# Patient Record
Sex: Male | Born: 1970 | Race: Black or African American | Hispanic: No | Marital: Single | Smoking: Never smoker
Health system: Southern US, Community
[De-identification: ages and names within clinical notes are randomized; demographics above are authoritative.]

## PROBLEM LIST (undated history)

## (undated) DIAGNOSIS — I10 Essential (primary) hypertension: Secondary | ICD-10-CM

## (undated) DIAGNOSIS — B20 Human immunodeficiency virus [HIV] disease: Secondary | ICD-10-CM

## (undated) DIAGNOSIS — N189 Chronic kidney disease, unspecified: Secondary | ICD-10-CM

## (undated) HISTORY — DX: Chronic kidney disease, unspecified: N18.9

## (undated) HISTORY — PX: OTHER SURGICAL HISTORY: SHX169

---

## 1998-11-16 ENCOUNTER — Encounter: Payer: Self-pay | Admitting: Infectious Disease

## 2007-05-11 ENCOUNTER — Ambulatory Visit: Payer: Self-pay | Admitting: Infectious Disease

## 2007-05-11 ENCOUNTER — Encounter: Admission: RE | Admit: 2007-05-11 | Discharge: 2007-05-11 | Payer: Self-pay | Admitting: Infectious Disease

## 2007-05-11 ENCOUNTER — Encounter: Payer: Self-pay | Admitting: Internal Medicine

## 2007-05-11 DIAGNOSIS — I10 Essential (primary) hypertension: Secondary | ICD-10-CM | POA: Insufficient documentation

## 2007-05-11 DIAGNOSIS — B20 Human immunodeficiency virus [HIV] disease: Secondary | ICD-10-CM | POA: Insufficient documentation

## 2007-05-11 LAB — CONVERTED CEMR LAB
Albumin: 4.7 g/dL (ref 3.5–5.2)
BUN: 16 mg/dL (ref 6–23)
CO2: 22 meq/L (ref 19–32)
Eosinophils Absolute: 0.1 10*3/uL — ABNORMAL LOW (ref 0.2–0.7)
Eosinophils Relative: 2 % (ref 0–5)
GC Probe Amp, Urine: NEGATIVE
Glucose, Bld: 82 mg/dL (ref 70–99)
HCT: 44.1 % (ref 39.0–52.0)
HCV Ab: NEGATIVE
Hemoglobin: 14.7 g/dL (ref 13.0–17.0)
Hep B Core Total Ab: NEGATIVE
Hep B S Ab: NEGATIVE
Hep B S Ab: NEGATIVE
Hepatitis B Surface Ag: NEGATIVE
Hepatitis B Surface Ag: NEGATIVE
Leukocytes, UA: NEGATIVE
Lymphocytes Relative: 38 % (ref 12–46)
Lymphs Abs: 2.3 10*3/uL (ref 0.7–4.0)
MCV: 95.2 fL (ref 78.0–100.0)
Monocytes Absolute: 0.4 10*3/uL (ref 0.1–1.0)
Monocytes Relative: 7 % (ref 3–12)
Nitrite: NEGATIVE
Platelets: 180 10*3/uL (ref 150–400)
RBC / HPF: NONE SEEN (ref <3)
RBC: 4.63 M/uL (ref 4.22–5.81)
Sodium: 142 meq/L (ref 135–145)
Specific Gravity, Urine: 1.015 (ref 1.005–1.03)
Total Bilirubin: 0.6 mg/dL (ref 0.3–1.2)
Total Protein: 7.6 g/dL (ref 6.0–8.3)
Urine Glucose: NEGATIVE mg/dL
WBC: 5.9 10*3/uL (ref 4.0–10.5)
pH: 5.5 (ref 5.0–8.0)

## 2007-05-27 ENCOUNTER — Ambulatory Visit: Payer: Self-pay | Admitting: Infectious Disease

## 2007-05-27 DIAGNOSIS — E291 Testicular hypofunction: Secondary | ICD-10-CM | POA: Insufficient documentation

## 2007-05-27 DIAGNOSIS — N189 Chronic kidney disease, unspecified: Secondary | ICD-10-CM | POA: Insufficient documentation

## 2007-05-27 DIAGNOSIS — A630 Anogenital (venereal) warts: Secondary | ICD-10-CM | POA: Insufficient documentation

## 2007-05-27 LAB — CONVERTED CEMR LAB
BUN: 19 mg/dL (ref 6–23)
CO2: 22 meq/L (ref 19–32)
Chloride: 107 meq/L (ref 96–112)
Creatinine, Ser: 1.96 mg/dL — ABNORMAL HIGH (ref 0.40–1.50)
Glucose, Bld: 86 mg/dL (ref 70–99)

## 2007-06-09 ENCOUNTER — Telehealth: Payer: Self-pay | Admitting: Infectious Disease

## 2007-06-09 DIAGNOSIS — R809 Proteinuria, unspecified: Secondary | ICD-10-CM

## 2007-06-22 ENCOUNTER — Encounter: Payer: Self-pay | Admitting: Infectious Disease

## 2007-08-31 ENCOUNTER — Ambulatory Visit: Payer: Self-pay | Admitting: Infectious Disease

## 2007-08-31 ENCOUNTER — Encounter: Admission: RE | Admit: 2007-08-31 | Discharge: 2007-08-31 | Payer: Self-pay | Admitting: Infectious Diseases

## 2007-08-31 LAB — CONVERTED CEMR LAB
AST: 15 units/L (ref 0–37)
Albumin: 4.7 g/dL (ref 3.5–5.2)
Alkaline Phosphatase: 63 units/L (ref 39–117)
BUN: 10 mg/dL (ref 6–23)
Basophils Absolute: 0 10*3/uL (ref 0.0–0.1)
Basophils Relative: 0 % (ref 0–1)
Eosinophils Absolute: 0 10*3/uL (ref 0.0–0.7)
MCHC: 33.9 g/dL (ref 30.0–36.0)
MCV: 101.2 fL — ABNORMAL HIGH (ref 78.0–100.0)
Monocytes Relative: 12 % (ref 3–12)
Neutrophils Relative %: 53 % (ref 43–77)
Platelets: 210 10*3/uL (ref 150–400)
Potassium: 4.2 meq/L (ref 3.5–5.3)
RDW: 17.1 % — ABNORMAL HIGH (ref 11.5–15.5)
Sodium: 141 meq/L (ref 135–145)

## 2007-09-14 ENCOUNTER — Ambulatory Visit: Payer: Self-pay | Admitting: Infectious Disease

## 2007-09-21 ENCOUNTER — Ambulatory Visit: Payer: Self-pay | Admitting: Infectious Disease

## 2007-09-21 LAB — CONVERTED CEMR LAB
CO2: 23 meq/L (ref 19–32)
Calcium: 10.2 mg/dL (ref 8.4–10.5)
Glucose, Bld: 81 mg/dL (ref 70–99)
Sodium: 140 meq/L (ref 135–145)

## 2007-10-13 ENCOUNTER — Encounter: Payer: Self-pay | Admitting: Infectious Disease

## 2007-10-16 ENCOUNTER — Telehealth: Payer: Self-pay | Admitting: Infectious Disease

## 2007-12-02 ENCOUNTER — Encounter: Admission: RE | Admit: 2007-12-02 | Discharge: 2007-12-02 | Payer: Self-pay | Admitting: Infectious Disease

## 2007-12-02 ENCOUNTER — Ambulatory Visit: Payer: Self-pay | Admitting: Infectious Disease

## 2007-12-02 LAB — CONVERTED CEMR LAB
ALT: 15 units/L (ref 0–53)
Alkaline Phosphatase: 54 units/L (ref 39–117)
Basophils Absolute: 0 10*3/uL (ref 0.0–0.1)
Basophils Relative: 0 % (ref 0–1)
Eosinophils Absolute: 0.1 10*3/uL (ref 0.0–0.7)
Eosinophils Relative: 1 % (ref 0–5)
Free T4: 1.22 ng/dL (ref 0.89–1.80)
HCT: 44.8 % (ref 39.0–52.0)
LDL Cholesterol: 95 mg/dL (ref 0–99)
MCV: 99.1 fL (ref 78.0–100.0)
Platelets: 186 10*3/uL (ref 150–400)
RDW: 15.2 % (ref 11.5–15.5)
Sodium: 141 meq/L (ref 135–145)
T3, Total: 123.8 ng/dL (ref 80.0–204.0)
Total Bilirubin: 3.3 mg/dL — ABNORMAL HIGH (ref 0.3–1.2)
Total Protein: 7.5 g/dL (ref 6.0–8.3)
VLDL: 65 mg/dL — ABNORMAL HIGH (ref 0–40)

## 2007-12-10 ENCOUNTER — Telehealth (INDEPENDENT_AMBULATORY_CARE_PROVIDER_SITE_OTHER): Payer: Self-pay | Admitting: *Deleted

## 2007-12-21 ENCOUNTER — Ambulatory Visit: Payer: Self-pay | Admitting: Infectious Disease

## 2008-01-21 ENCOUNTER — Encounter (INDEPENDENT_AMBULATORY_CARE_PROVIDER_SITE_OTHER): Payer: Self-pay | Admitting: General Surgery

## 2008-01-21 ENCOUNTER — Ambulatory Visit (HOSPITAL_BASED_OUTPATIENT_CLINIC_OR_DEPARTMENT_OTHER): Admission: RE | Admit: 2008-01-21 | Discharge: 2008-01-21 | Payer: Self-pay | Admitting: General Surgery

## 2008-01-29 ENCOUNTER — Encounter: Payer: Self-pay | Admitting: Infectious Disease

## 2008-06-23 ENCOUNTER — Ambulatory Visit: Payer: Self-pay | Admitting: Infectious Disease

## 2008-06-23 LAB — CONVERTED CEMR LAB
Alkaline Phosphatase: 53 units/L (ref 39–117)
Bilirubin Urine: NEGATIVE
Cholesterol: 135 mg/dL (ref 0–200)
Creatinine, Ser: 1.75 mg/dL — ABNORMAL HIGH (ref 0.40–1.50)
Eosinophils Absolute: 0.1 10*3/uL (ref 0.0–0.7)
Eosinophils Relative: 1 % (ref 0–5)
Glucose, Bld: 88 mg/dL (ref 70–99)
HCT: 41.2 % (ref 39.0–52.0)
HDL: 22 mg/dL — ABNORMAL LOW (ref >39)
HIV 1 RNA Quant: 81 copies/mL — ABNORMAL HIGH (ref <48)
HIV-1 RNA Quant, Log: 1.91 — ABNORMAL HIGH (ref <1.68)
Hemoglobin, Urine: NEGATIVE
Hemoglobin: 14.6 g/dL (ref 13.0–17.0)
Ketones, ur: NEGATIVE mg/dL
LDL Cholesterol: 44 mg/dL (ref 0–99)
Lymphs Abs: 1.8 10*3/uL (ref 0.7–4.0)
MCV: 96.7 fL (ref 78.0–100.0)
Monocytes Absolute: 0.6 10*3/uL (ref 0.1–1.0)
Nitrite: NEGATIVE
Platelets: 177 10*3/uL (ref 150–400)
Protein, ur: 30 mg/dL — AB
RDW: 15 % (ref 11.5–15.5)
Sodium: 143 meq/L (ref 135–145)
Total Bilirubin: 1 mg/dL (ref 0.3–1.2)
Total CHOL/HDL Ratio: 6.1
Total Protein: 7.4 g/dL (ref 6.0–8.3)
Triglycerides: 343 mg/dL — ABNORMAL HIGH (ref <150)
Urobilinogen, UA: 1 (ref 0.0–1.0)
VLDL: 69 mg/dL — ABNORMAL HIGH (ref 0–40)
WBC: 5.2 10*3/uL (ref 4.0–10.5)

## 2008-07-05 ENCOUNTER — Encounter: Payer: Self-pay | Admitting: Infectious Disease

## 2008-07-29 ENCOUNTER — Encounter: Payer: Self-pay | Admitting: Infectious Disease

## 2008-08-29 ENCOUNTER — Ambulatory Visit: Payer: Self-pay | Admitting: Infectious Disease

## 2008-08-29 DIAGNOSIS — E781 Pure hyperglyceridemia: Secondary | ICD-10-CM

## 2008-08-29 LAB — CONVERTED CEMR LAB
ALT: 14 units/L (ref 0–53)
AST: 17 units/L (ref 0–37)
Bacteria, UA: NONE SEEN
Basophils Relative: 0 % (ref 0–1)
Bilirubin Urine: NEGATIVE
CO2: 23 meq/L (ref 19–32)
Chlamydia, Swab/Urine, PCR: NEGATIVE
Chloride: 104 meq/L (ref 96–112)
Creatinine, Ser: 1.92 mg/dL — ABNORMAL HIGH (ref 0.40–1.50)
Eosinophils Absolute: 0 10*3/uL (ref 0.0–0.7)
Ketones, ur: NEGATIVE mg/dL
Lymphs Abs: 1.7 10*3/uL (ref 0.7–4.0)
Monocytes Relative: 11 % (ref 3–12)
Neutro Abs: 3.3 10*3/uL (ref 1.7–7.7)
Neutrophils Relative %: 58 % (ref 43–77)
Platelets: 174 10*3/uL (ref 150–400)
RBC / HPF: NONE SEEN (ref <3)
RBC: 4.48 M/uL (ref 4.22–5.81)
Sodium: 138 meq/L (ref 135–145)
Specific Gravity, Urine: 1.015 (ref 1.005–1.030)
Testosterone: 331.02 ng/dL — ABNORMAL LOW (ref 350–890)
Total Bilirubin: 0.9 mg/dL (ref 0.3–1.2)
Total Protein: 7.8 g/dL (ref 6.0–8.3)
Urine Glucose: NEGATIVE mg/dL
WBC, UA: NONE SEEN cells/hpf (ref <3)
WBC: 5.6 10*3/uL (ref 4.0–10.5)

## 2008-09-01 ENCOUNTER — Encounter: Payer: Self-pay | Admitting: Infectious Disease

## 2008-09-01 LAB — CONVERTED CEMR LAB
HIV 1 RNA Quant: 1820 copies/mL — ABNORMAL HIGH (ref <48)
HIV-1 RNA Quant, Log: 3.26 — ABNORMAL HIGH (ref <1.68)

## 2008-09-06 ENCOUNTER — Telehealth: Payer: Self-pay | Admitting: Infectious Disease

## 2008-12-12 ENCOUNTER — Ambulatory Visit: Payer: Self-pay | Admitting: Infectious Disease

## 2008-12-12 LAB — CONVERTED CEMR LAB
ALT: 18 units/L (ref 0–53)
Alkaline Phosphatase: 58 units/L (ref 39–117)
BUN: 11 mg/dL (ref 6–23)
Basophils Absolute: 0 10*3/uL (ref 0.0–0.1)
Calcium: 9.6 mg/dL (ref 8.4–10.5)
Creatinine, Ser: 1.79 mg/dL — ABNORMAL HIGH (ref 0.40–1.50)
Eosinophils Absolute: 0.1 10*3/uL (ref 0.0–0.7)
Glucose, Bld: 101 mg/dL — ABNORMAL HIGH (ref 70–99)
HDL: 23 mg/dL — ABNORMAL LOW (ref >39)
HIV 1 RNA Quant: 1040 copies/mL — ABNORMAL HIGH (ref <48)
Lymphocytes Relative: 50 % — ABNORMAL HIGH (ref 12–46)
Lymphs Abs: 2.4 10*3/uL (ref 0.7–4.0)
MCV: 96.6 fL (ref 78.0–100.0)
Neutrophils Relative %: 40 % — ABNORMAL LOW (ref 43–77)
Platelets: 159 10*3/uL (ref 150–400)
RDW: 17.2 % — ABNORMAL HIGH (ref 11.5–15.5)
Total Bilirubin: 1 mg/dL (ref 0.3–1.2)
Total Protein: 7.4 g/dL (ref 6.0–8.3)
WBC: 4.8 10*3/uL (ref 4.0–10.5)

## 2008-12-22 ENCOUNTER — Ambulatory Visit: Payer: Self-pay | Admitting: Infectious Disease

## 2008-12-22 LAB — CONVERTED CEMR LAB
Cholesterol, target level: 200 mg/dL
HDL goal, serum: 40 mg/dL
LDL Goal: 130 mg/dL

## 2009-02-21 ENCOUNTER — Ambulatory Visit: Payer: Self-pay | Admitting: Infectious Disease

## 2009-02-21 LAB — CONVERTED CEMR LAB
AST: 22 units/L (ref 0–37)
Albumin: 4.3 g/dL (ref 3.5–5.2)
Alkaline Phosphatase: 59 units/L (ref 39–117)
BUN: 13 mg/dL (ref 6–23)
Basophils Absolute: 0 10*3/uL (ref 0.0–0.1)
Basophils Relative: 0 % (ref 0–1)
Creatinine, Ser: 1.63 mg/dL — ABNORMAL HIGH (ref 0.40–1.50)
Eosinophils Absolute: 0.1 10*3/uL (ref 0.0–0.7)
Eosinophils Relative: 2 % (ref 0–5)
Glucose, Bld: 101 mg/dL — ABNORMAL HIGH (ref 70–99)
HCT: 37.2 % — ABNORMAL LOW (ref 39.0–52.0)
HIV 1 RNA Quant: 139 copies/mL — ABNORMAL HIGH (ref <48)
Hemoglobin: 12.8 g/dL — ABNORMAL LOW (ref 13.0–17.0)
MCHC: 34.4 g/dL (ref 30.0–36.0)
MCV: 102.2 fL — ABNORMAL HIGH (ref >=78.0)
Monocytes Absolute: 0.3 10*3/uL (ref 0.1–1.0)
Monocytes Relative: 6 % (ref 3–12)
Potassium: 3.9 meq/L (ref 3.5–5.3)
RBC: 3.64 M/uL — ABNORMAL LOW (ref 4.22–5.81)
RDW: 15.3 % (ref 11.5–15.5)
Total Bilirubin: 4.8 mg/dL — ABNORMAL HIGH (ref 0.3–1.2)

## 2009-03-13 ENCOUNTER — Ambulatory Visit: Payer: Self-pay | Admitting: Infectious Disease

## 2009-06-28 ENCOUNTER — Ambulatory Visit: Payer: Self-pay | Admitting: Infectious Diseases

## 2009-06-28 LAB — CONVERTED CEMR LAB
Albumin: 4.4 g/dL (ref 3.5–5.2)
BUN: 13 mg/dL (ref 6–23)
Basophils Absolute: 0 10*3/uL (ref 0.0–0.1)
Basophils Relative: 0 % (ref 0–1)
CO2: 23 meq/L (ref 19–32)
Cholesterol: 156 mg/dL (ref 0–200)
Eosinophils Relative: 2 % (ref 0–5)
Glucose, Bld: 75 mg/dL (ref 70–99)
HCT: 37.8 % — ABNORMAL LOW (ref 39.0–52.0)
HDL: 21 mg/dL — ABNORMAL LOW (ref >39)
HIV-1 RNA Quant, Log: 1.89 — ABNORMAL HIGH (ref <1.68)
Hemoglobin: 13.4 g/dL (ref 13.0–17.0)
MCHC: 35.4 g/dL (ref 30.0–36.0)
Monocytes Absolute: 0.5 10*3/uL (ref 0.1–1.0)
Monocytes Relative: 8 % (ref 3–12)
Neutro Abs: 2.9 10*3/uL (ref 1.7–7.7)
Potassium: 4 meq/L (ref 3.5–5.3)
RBC: 3.84 M/uL — ABNORMAL LOW (ref 4.22–5.81)
RDW: 14.6 % (ref 11.5–15.5)
Sodium: 141 meq/L (ref 135–145)
Total Protein: 7.3 g/dL (ref 6.0–8.3)
Triglycerides: 389 mg/dL — ABNORMAL HIGH (ref <150)

## 2009-07-10 ENCOUNTER — Ambulatory Visit: Payer: Self-pay | Admitting: Infectious Disease

## 2009-07-10 DIAGNOSIS — F4321 Adjustment disorder with depressed mood: Secondary | ICD-10-CM

## 2009-10-10 ENCOUNTER — Ambulatory Visit: Payer: Self-pay | Admitting: Infectious Disease

## 2009-10-10 LAB — CONVERTED CEMR LAB
ALT: 15 units/L (ref 0–53)
Albumin: 4.3 g/dL (ref 3.5–5.2)
CO2: 22 meq/L (ref 19–32)
Calcium: 10 mg/dL (ref 8.4–10.5)
Chloride: 108 meq/L (ref 96–112)
Eosinophils Absolute: 0.1 10*3/uL (ref 0.0–0.7)
Glucose, Bld: 82 mg/dL (ref 70–99)
HIV 1 RNA Quant: <48 copies/mL (ref <48)
Lymphocytes Relative: 43 % (ref 12–46)
Lymphs Abs: 2 10*3/uL (ref 0.7–4.0)
MCV: 93.3 fL (ref 78.0–100.0)
Neutro Abs: 2.3 10*3/uL (ref 1.7–7.7)
Neutrophils Relative %: 49 % (ref 43–77)
Platelets: 185 10*3/uL (ref 150–400)
Potassium: 3.9 meq/L (ref 3.5–5.3)
RBC: 4.32 M/uL (ref 4.22–5.81)
Sodium: 140 meq/L (ref 135–145)
Total Protein: 7.4 g/dL (ref 6.0–8.3)
WBC: 4.8 10*3/uL (ref 4.0–10.5)

## 2009-11-02 ENCOUNTER — Ambulatory Visit (HOSPITAL_COMMUNITY): Admission: RE | Admit: 2009-11-02 | Discharge: 2009-11-02 | Payer: Self-pay | Admitting: Infectious Disease

## 2009-11-02 ENCOUNTER — Ambulatory Visit: Payer: Self-pay | Admitting: Infectious Disease

## 2009-11-02 DIAGNOSIS — R059 Cough, unspecified: Secondary | ICD-10-CM | POA: Insufficient documentation

## 2009-11-02 DIAGNOSIS — R05 Cough: Secondary | ICD-10-CM

## 2009-11-02 LAB — CONVERTED CEMR LAB
ALT: 13 units/L (ref 0–53)
AST: 20 units/L (ref 0–37)
Basophils Relative: 0 % (ref 0–1)
Creatinine, Ser: 1.5 mg/dL (ref 0.40–1.50)
Eosinophils Absolute: 0.1 10*3/uL (ref 0.0–0.7)
Lymphs Abs: 1.6 10*3/uL (ref 0.7–4.0)
MCV: 92.1 fL (ref 78.0–100.0)
Neutro Abs: 4.5 10*3/uL (ref 1.7–7.7)
Neutrophils Relative %: 67 % (ref 43–77)
Platelets: 187 10*3/uL (ref 150–400)
Total Bilirubin: 4.8 mg/dL — ABNORMAL HIGH (ref 0.3–1.2)
WBC: 6.7 10*3/uL (ref 4.0–10.5)

## 2009-12-21 ENCOUNTER — Emergency Department (HOSPITAL_COMMUNITY): Admission: EM | Admit: 2009-12-21 | Discharge: 2009-12-21 | Payer: Self-pay | Admitting: Emergency Medicine

## 2009-12-27 ENCOUNTER — Ambulatory Visit: Payer: Self-pay | Admitting: Infectious Disease

## 2009-12-27 LAB — CONVERTED CEMR LAB
ALT: 16 units/L (ref 0–53)
AST: 19 units/L (ref 0–37)
Basophils Absolute: 0 10*3/uL (ref 0.0–0.1)
Basophils Relative: 1 % (ref 0–1)
CO2: 21 meq/L (ref 19–32)
Cholesterol: 173 mg/dL (ref 0–200)
Creatinine, Ser: 1.81 mg/dL — ABNORMAL HIGH (ref 0.40–1.50)
Eosinophils Relative: 3 % (ref 0–5)
GC Probe Amp, Urine: NEGATIVE
HCT: 43.1 % (ref 39.0–52.0)
Hemoglobin: 14.8 g/dL (ref 13.0–17.0)
MCHC: 34.3 g/dL (ref 30.0–36.0)
MCV: 92.3 fL (ref 78.0–100.0)
Monocytes Absolute: 0.4 10*3/uL (ref 0.1–1.0)
RDW: 15.1 % (ref 11.5–15.5)
Total Bilirubin: 2.2 mg/dL — ABNORMAL HIGH (ref 0.3–1.2)
Total CHOL/HDL Ratio: 6.9
VLDL: 44 mg/dL — ABNORMAL HIGH (ref 0–40)

## 2010-01-05 ENCOUNTER — Telehealth: Payer: Self-pay | Admitting: Infectious Disease

## 2010-01-22 ENCOUNTER — Ambulatory Visit: Payer: Self-pay | Admitting: Infectious Disease

## 2010-01-26 ENCOUNTER — Telehealth: Payer: Self-pay | Admitting: Infectious Disease

## 2010-05-07 ENCOUNTER — Ambulatory Visit: Payer: Self-pay | Admitting: Infectious Disease

## 2010-05-07 LAB — CONVERTED CEMR LAB
Albumin: 4.5 g/dL (ref 3.5–5.2)
Alkaline Phosphatase: 53 units/L (ref 39–117)
BUN: 14 mg/dL (ref 6–23)
CO2: 26 meq/L (ref 19–32)
Calcium: 10.6 mg/dL — ABNORMAL HIGH (ref 8.4–10.5)
Glucose, Bld: 86 mg/dL (ref 70–99)
HDL: 25 mg/dL — ABNORMAL LOW (ref >39)
HIV 1 RNA Quant: <20 copies/mL (ref <20)
Hemoglobin: 14.1 g/dL (ref 13.0–17.0)
LDL Cholesterol: 105 mg/dL — ABNORMAL HIGH (ref 0–99)
Lymphocytes Relative: 34 % (ref 12–46)
Lymphs Abs: 1.7 10*3/uL (ref 0.7–4.0)
MCHC: 35 g/dL (ref 30.0–36.0)
Monocytes Absolute: 0.4 10*3/uL (ref 0.1–1.0)
Monocytes Relative: 7 % (ref 3–12)
Neutro Abs: 2.8 10*3/uL (ref 1.7–7.7)
Potassium: 4.7 meq/L (ref 3.5–5.3)
RBC: 4.08 M/uL — ABNORMAL LOW (ref 4.22–5.81)
Total CHOL/HDL Ratio: 6.7
Triglycerides: 186 mg/dL — ABNORMAL HIGH (ref <150)
WBC: 4.9 10*3/uL (ref 4.0–10.5)

## 2010-05-29 ENCOUNTER — Ambulatory Visit: Payer: Self-pay | Admitting: Infectious Disease

## 2010-05-29 DIAGNOSIS — R42 Dizziness and giddiness: Secondary | ICD-10-CM

## 2010-05-29 DIAGNOSIS — F0630 Mood disorder due to known physiological condition, unspecified: Secondary | ICD-10-CM

## 2010-05-29 DIAGNOSIS — R51 Headache: Secondary | ICD-10-CM

## 2010-05-29 DIAGNOSIS — R519 Headache, unspecified: Secondary | ICD-10-CM | POA: Insufficient documentation

## 2010-05-30 ENCOUNTER — Emergency Department (HOSPITAL_COMMUNITY)
Admission: EM | Admit: 2010-05-30 | Discharge: 2010-05-30 | Payer: Self-pay | Source: Home / Self Care | Admitting: Emergency Medicine

## 2010-07-03 ENCOUNTER — Encounter: Payer: Self-pay | Admitting: Infectious Disease

## 2010-07-03 ENCOUNTER — Ambulatory Visit
Admission: RE | Admit: 2010-07-03 | Discharge: 2010-07-03 | Payer: Self-pay | Source: Home / Self Care | Attending: Infectious Diseases | Admitting: Infectious Diseases

## 2010-07-03 LAB — CONVERTED CEMR LAB
Chlamydia, Swab/Urine, PCR: NEGATIVE
GC Probe Amp, Genital: NEGATIVE
LDL Cholesterol: 76 mg/dL (ref 0–99)
VLDL: 50 mg/dL — ABNORMAL HIGH (ref 0–40)

## 2010-07-09 LAB — T-HELPER CELL (CD4) - (RCID CLINIC ONLY)
CD4 % Helper T Cell: 33 % (ref 33–55)
CD4 T Cell Abs: 790 uL (ref 400–2700)

## 2010-07-17 NOTE — Assessment & Plan Note (Signed)
Summary: f/u TY   Primary Provider:  Paulette Blanch Dam MD  CC:  f/u.  History of Present Illness: pt rescheduled this visit   Current Allergies (reviewed today): No known allergies  Vital Signs:  Patient profile:   40 year old male Height:      69 inches (175.26 cm) Weight:      174.75 pounds (79.43 kg) BMI:     25.90 Temp:     98.4 degrees F (36.89 degrees C) oral Pulse rate:   75 / minute BP sitting:   145 / 96  (left arm)  Vitals Entered By: Starleen Arms CMA (January 22, 2010 9:03 AM) CC: f/u Is Patient Diabetic? No Pain Assessment Patient in pain? no      Nutritional Status BMI of 25 - 29 = overweight  Does patient need assistance? Functional Status Self care Ambulation Normal

## 2010-07-17 NOTE — Progress Notes (Signed)
Summary: Request to restart Megace  Phone Note From Pharmacy   Caller: [facility] Reason for Call: Needs renewal Summary of Call: Received a electronic refill request for patient's Megace ES.  It was last filled on 10/21/2008. This medication was removed from patient's EMR medicaton list in 02/2009.  Did you want to restart this for patient? Next office visit is July 27. Initial call taken by: Paulo Fruit  BS,CPht II,MPH,  January 05, 2010 10:35 AM  Follow-up for Phone Call        Let me talk tohim at his next visit about this medication. It does carry  a fair amt of unnecessary risks in his situation. Follow-up by: Acey Lav MD,  January 08, 2010 8:23 AM     Appended Document: Request to restart Megace Pharmacy notified and patient too.

## 2010-07-17 NOTE — Assessment & Plan Note (Signed)
Summary: 3 month f/u rescheduled from 10/26/2009   Visit Type:  Follow-up Primary Provider:  Paulette Blanch Dam MD  CC:  f/u .  History of Present Illness: 40 year old AA with HIV, HTN, CKD, hyperlipidmia  on epzicom and ritonavir boosted reyataz with undetectable viral load. He has as mentioned in the last note lost his sister to unexpected sudden cardiac death and he is still  coping with this loss. Patient has new complaint of productive cough.  Pt has been coughing up phlegm for a month. Had fever twice, 100.6. Phlegm typically clear but also with green material production worst at night.  Sleeps better on stomach and coughs less than on side or back. His roomate (former partner) has similar cough. Patient had nausea and vomiting once during this. He has lost 15 pounds since January but has been dieting and trying to lose weight.  Over 40 minutes spent o n encounter including 20 minutes of face to face and counselling.  Hypertension History:      Positive major cardiovascular risk factors include hyperlipidemia, hypertension, and family history for ischemic heart disease (females less than 25 years old & males less than 42 years old).  Negative major cardiovascular risk factors include male age less than 33 years old, no history of diabetes, and non-tobacco-user status.        Positive history for target organ damage include renal insufficiency.  Further assessment for target organ damage reveals no history of ASHD, cardiac end-organ damage (CHF/LVH), stroke/TIA, peripheral vascular disease, or hypertensive retinopathy.    Current Medications (verified): 1)  Androgel Pump 1 %  Gel (Testosterone) .... Apply Every Morning, Give A 30 Day Supply 2)  Epzicom 600-300 Mg  Tabs (Abacavir Sulfate-Lamivudine) .... Take 1 Tablet By Mouth Once A Day With Ritonavir and Atazanavir 3)  Reyataz 300 Mg  Caps (Atazanavir Sulfate) .... Take 1 Tablet By Mouth Once A Day With Ritonavir. Do Not Take Proton Pump  Inhibitor, H2 Blockers or Antacids With This Medicine 4)  Norvir 100 Mg  Caps (Ritonavir) .... Take 1 Capsule By Mouth Once A Day Take With Atazanavir (Reyataz) 5)  Lisinopril 40 Mg  Tabs (Lisinopril) .... Take 1 Tablet By Mouth Once A Day 6)  Norvasc 10 Mg  Tabs (Amlodipine Besylate) .... Take 1 Tablet By Mouth Once A Day 7)  Lopressor 100 Mg  Tabs (Metoprolol Tartrate) .... Take 1 Tablet By Mouth Two Times A Day 8)  Pravachol 10 Mg  Tabs (Pravastatin Sodium) .... Take 1 Tablet By Mouth Once A Day 9)  Lovaza 1 Gm Caps (Omega-3-Acid Ethyl Esters) .... Take 1 Tablet By Mouth Two Times A Day 10)  Marinol 2.5 Mg Caps (Dronabinol) .... Take 1 Tablet By Mouth Two Times As Needed For Appetite 11)  Avelox 400 Mg Tabs (Moxifloxacin Hcl) .... Take 1 Tablet By Mouth Once A Day For 7 Days 12)  Clonidine Hcl 0.1 Mg Tabs (Clonidine Hcl) .... Take 1 Tablet By Mouth Two Times A Day  Allergies (verified): No Known Drug Allergies    Preventive Screening-Counseling & Management  Alcohol-Tobacco     Alcohol drinks/day: 0     Smoking Status: never  Caffeine-Diet-Exercise     Caffeine use/day: sodas occassionally     Caffeine Counseling: not indicated; caffeine use is not excessive or problematic     Does Patient Exercise: yes     Type of exercise: walking     Exercise (avg: min/session): 30-60     Times/week: 3  Current Allergies (reviewed today): No known allergies  Past History:  Past Medical History: Last updated: 03/13/2009 HIV HTN CKD Hypogonadism:--low testosterone Anal warts claims he gained a lot of weight while taking megace  in manner sound like possible cushingoid effect. He currenlly only takes intermittently  Past Surgical History: Last updated: 08/29/2008 ANal warts excited  Family History: Last updated: 07/10/2009 Mom alive  72 with DM, HTN, Dad with death in 8s sudden cardiac, possibly due to CAD. one sister with HTN, dm 2 brothers with HTN One sister with sudden  cardiac death as well  Social History: Last updated: 03/13/2009 has partner HIV positive, but lives alone Never Smoked Alcohol use-yes Drug use-no  Risk Factors: Alcohol Use: 0 (11/02/2009) Caffeine Use: sodas occassionally (11/02/2009) Exercise: yes (11/02/2009)  Risk Factors: Smoking Status: never (11/02/2009)  Review of Systems       The patient complains of fever, weight loss, prolonged cough, and depression.  The patient denies anorexia, weight gain, vision loss, decreased hearing, hoarseness, chest pain, syncope, dyspnea on exertion, peripheral edema, headaches, hemoptysis, abdominal pain, melena, hematochezia, severe indigestion/heartburn, hematuria, incontinence, genital sores, muscle weakness, suspicious skin lesions, transient blindness, difficulty walking, unusual weight change, abnormal bleeding, enlarged lymph nodes, and angioedema.    Vital Signs:  Patient profile:   40 year old male Height:      69 inches (175.26 cm) Weight:      169 pounds (76.82 kg) BMI:     25.05 Temp:     98.6 degrees F (37.00 degrees C) oral Pulse rate:   71 / minute BP sitting:   146 / 96  (left arm)  Vitals Entered By: Starleen Arms CMA (Nov 02, 2009 10:05 AM) CC: f/u  Is Patient Diabetic? No Pain Assessment Patient in pain? no      Nutritional Status BMI of 25 - 29 = overweight Nutritional Status Detail nl  Have you ever been in a relationship where you felt threatened, hurt or afraid?No   Does patient need assistance? Functional Status Self care Ambulation Normal        Medication Adherence: 11/02/2009   Adherence to medications reviewed with patient. Counseling to provide adequate adherence provided   Prevention For Positives: 11/02/2009   Safe sex practices discussed with patient. Condoms offered.   Education Materials Provided: 11/02/2009 Safe sex practices discussed with patient. Condoms offered.                          Physical Exam  General:  alert.   well-developed and well-nourished.   Head:  normocephalic and atraumatic.   Eyes:  vision grossly intact, pupils equal, pupils round, and pupils reactive to light.   Ears:  no external deformities.  ear piercing(s) noted.   Nose:  no external deformity.  no external erythema, no nasal discharge, and no mucosal edema.   Mouth:  pharynx pink and moist, no erythema, and no exudates.   Neck:  supple and full ROM.   Lungs:  normal respiratory effort, no crackles, and no wheezes.   Heart:  normal rate, regular rhythm, no murmur, no gallop, and no rub.   Abdomen:  soft, non-tender, normal bowel sounds, no distention, no masses, Msk:  normal ROM.  normal ROM.   Extremities:  No clubbing, cyanosis, edema, or deformity noted with normal full range of motion of all joints.   Neurologic:  alert & oriented X3.  strength normal in all extremities and gait normal.  Skin:  no rashes.  no ecchymoses.   Psych:  Oriented X3, memory intact for recent and remote,  dysphoric affect.     Impression & Recommendations:  Problem # 1:  PRODUCTIVE COUGH (ICD-786.2) Assessment New I will check cxr, ldh, quantiferon gold, cbc, bmp, cxr 2 view.  More likely an atypical pneumonia. I have give him avelox x 7 days His updated medication list for this problem includes:    Avelox 400 Mg Tabs (Moxifloxacin hcl) .Marland Kitchen... Take 1 tablet by mouth once a day for 7 days  Orders: T-Comprehensive Metabolic Panel [id number]) T-CBC w/Diff [id number]) T-[facility]) (83615-LDH) T- * Misc. Laboratory test [id number]) CXR- 2view (CXR) Est. Patient Level V (46962)  Problem # 2:  MOURNING (ICD-309.0) Assessment: Improved  still does not want antidepressant and feels he is coping well at this time  Orders: Est. Patient Level V (95284)  Problem # 3:  HIV INFECTION (ICD-042)  His updated medication list for this problem includes:    Avelox 400 Mg Tabs (Moxifloxacin hcl) .Marland Kitchen... Take 1 tablet by mouth once a day for 7  days  Orders: T-GC Probe, urine [id number]) T-Chlamydia  Probe, urine ([id number]) Est. Patient Level V (99215)Future Orders: T-CD4SP ([facility]) (CD4SP) ... 12/18/2009 T-HIV Viral Load [id number]) ... 12/18/2009 T-CBC w/Diff ([id number]) ... 12/18/2009 T-Lipid Profile [id number]) ... 12/18/2009 T-Comprehensive Metabolic Panel [id number]) ... 12/18/2009  Problem # 4:  MOURNING (ICD-309.0)  Orders: Est. Patient Level V (73220)  Problem # 5:  ESSENTIAL HYPERTENSION, BENIGN (ICD-401.1) Assessment: Comment Only  I am adding clonidine His updated medication list for this problem includes:    Lisinopril 40 Mg Tabs (Lisinopril) .Marland Kitchen... Take 1 tablet by mouth once a day    Norvasc 10 Mg Tabs (Amlodipine besylate) .Marland Kitchen... Take 1 tablet by mouth once a day    Lopressor 100 Mg Tabs (Metoprolol tartrate) .Marland Kitchen... Take 1 tablet by mouth two times a day    Clonidine Hcl 0.1 Mg Tabs (Clonidine hcl) .Marland Kitchen... Take 1 tablet by mouth two times a day  Orders: Est. Patient Level V (25427)  Problem # 6:  PROTEINURIA (ICD-791.0)  on aCEi  Orders: Est. Patient Level V (06237)  Problem # 7:  CHRONIC KIDNEY DISEASE UNSPECIFIED (ICD-585.9)  adding clonidine  Orders: Est. Patient Level V (62831)  Medications Added to Medication List This Visit: 1)  Avelox 400 Mg Tabs (Moxifloxacin hcl) .... Take 1 tablet by mouth once a day for 7 days 2)  Clonidine Hcl 0.1 Mg Tabs (Clonidine hcl) .... Take 1 tablet by mouth two times a day  Hypertension Assessment/Plan:      The patient's hypertensive risk group is category C: Target organ damage and/or diabetes.  His calculated 10 year risk of coronary heart disease is 4 %.  Today's blood pressure is 146/96.  His blood pressure goal is < 140/90.  Patient Instructions: 1)  rtc in 2 months time   Prescriptions: CLONIDINE HCL 0.1 MG TABS (CLONIDINE HCL) Take 1 tablet by mouth two times a day  #60 x 5   Entered and Authorized by:   Acey Lav MD    Signed by:   Paulette Blanch Dam MD on 11/02/2009   Method used:   Electronically to        [facility] (retail)       [address]       [place], [place]  [id number]       Ph: [phone]  Fax: [phone]   RxID:   [id number] AVELOX 400 MG TABS (MOXIFLOXACIN HCL) Take 1 tablet by mouth once a day for 7 days  #7 x 0   Entered and Authorized by:   Acey Lav MD   Signed by:   Paulette Blanch Dam MD on 11/02/2009   Method used:   Electronically to        [facility] (retail)       [address]       [place], [place]  [id number]       Ph: [phone]       Fax: [phone]   RxID:   [id number]

## 2010-07-17 NOTE — Assessment & Plan Note (Signed)
Summary: EST-CK/FU/MEDS/CFB   Visit Type:  Follow-up Primary Provider:  Paulette Blanch Dam MD  CC:  f/u labs and Lipid Management.  History of Present Illness: 40 year old AA with HIV, HTN, CKD, hyperlipidmia  on epzicom and ritonavir boosted reyataz. His most recent viral load was 78 copies despite his missing one week of meds prior to testing. He has undergrone significant life stressore with sudden unanticipated loss of his sisteer from sudden cardiac death due to myocardial infarction. She had no known hx of CAD but apparently felt unwell when  a friend called her. She was found hours later when relative in her house listened to voice mail and then found sister unresponsive. Mr. Eudy is coping with this as best he can. 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 I offerered him antidepressants and counselling but he feels he ic soping well with family and friends support. He has no other complaints today. He continues tohave ones one HIV positive partner with whom he lives and with whom he is sexually active but months ago. Partner is on treatement. He had not started his marinol yet bcause he was concerned about becoming addicted as friend of his had told him he might.  Lipid Management History:      Positive NCEP/ATP III risk factors include HDL cholesterol less than 40, family history for ischemic heart disease (females less than 36 years old & males less than 33 years old), and hypertension.  Negative NCEP/ATP III risk factors include male age less than 36 years old, non-diabetic, non-tobacco-user status, no ASHD (atherosclerotic heart disease), no prior stroke/TIA, no peripheral vascular disease, and no history of aortic aneurysm.    Problems Prior to Update: 1)  Hypertriglyceridemia  (ICD-272.1) 2)  Proteinuria  (ICD-791.0) 3)  Health Screening  (ICD-V70.0) 4)  Testosterone Deficiency  (ICD-257.2) 5)  Condyloma Acuminatum  (ICD-078.11) 6)  Chronic  Kidney Disease Unspecified  (ICD-585.9) 7)  Essential Hypertension, Benign  (ICD-401.1) 8)  HIV Infection  (ICD-042)  Medications Prior to Update: 1)  Androgel Pump 1 %  Gel (Testosterone) .... Apply Every Morning, Give A 30 Day Supply 2)  Epzicom 600-300 Mg  Tabs (Abacavir Sulfate-Lamivudine) .... Take 1 Tablet By Mouth Once A Day With Ritonavir and Atazanavir 3)  Reyataz 300 Mg  Caps (Atazanavir Sulfate) .... Take 1 Tablet By Mouth Once A Day With Ritonavir. Do Not Take Proton Pump Inhibitor, H2 Blockers or Antacids With This Medicine 4)  Norvir 100 Mg  Caps (Ritonavir) .... Take 1 Capsule By Mouth Once A Day Take With Atazanavir (Reyataz) 5)  Lisinopril 40 Mg  Tabs (Lisinopril) .... Take 1 Tablet By Mouth Once A Day 6)  Norvasc 10 Mg  Tabs (Amlodipine Besylate) .... Take 1 Tablet By Mouth Once A Day 7)  Lopressor 100 Mg  Tabs (Metoprolol Tartrate) .... Take 1 Tablet By Mouth Two Times A Day 8)  Pravachol 10 Mg  Tabs (Pravastatin Sodium) .... Take 1 Tablet By Mouth Once A Day 9)  Lovaza 1 Gm Caps (Omega-3-Acid Ethyl Esters) .... Take 1 Tablet By Mouth Two Times A Day 10)  Marinol 2.5 Mg Caps (Dronabinol) .... Take 1 Tablet By Mouth Two Times As Needed For Appetite  Current Medications (verified): 1)  Androgel Pump 1 %  Gel (Testosterone) .... Apply Every Morning, Give A 30 Day Supply 2)  Epzicom 600-300 Mg  Tabs (Abacavir Sulfate-Lamivudine) .... Take 1 Tablet By Mouth Once A Day With Ritonavir  and Atazanavir 3)  Reyataz 300 Mg  Caps (Atazanavir Sulfate) .... Take 1 Tablet By Mouth Once A Day With Ritonavir. Do Not Take Proton Pump Inhibitor, H2 Blockers or Antacids With This Medicine 4)  Norvir 100 Mg  Caps (Ritonavir) .... Take 1 Capsule By Mouth Once A Day Take With Atazanavir (Reyataz) 5)  Lisinopril 40 Mg  Tabs (Lisinopril) .... Take 1 Tablet By Mouth Once A Day 6)  Norvasc 10 Mg  Tabs (Amlodipine Besylate) .... Take 1 Tablet By Mouth Once A Day 7)  Lopressor 100 Mg  Tabs (Metoprolol  Tartrate) .... Take 1 Tablet By Mouth Two Times A Day 8)  Pravachol 10 Mg  Tabs (Pravastatin Sodium) .... Take 1 Tablet By Mouth Once A Day 9)  Lovaza 1 Gm Caps (Omega-3-Acid Ethyl Esters) .... Take 1 Tablet By Mouth Two Times A Day 10)  Marinol 2.5 Mg Caps (Dronabinol) .... Take 1 Tablet By Mouth Two Times As Needed For Appetite  Allergies (verified): No Known Drug Allergies   Preventive Screening-Counseling & Management  Alcohol-Tobacco     Alcohol drinks/day: 0     Smoking Status: never  Caffeine-Diet-Exercise     Caffeine use/day: sodas occassionally     Caffeine Counseling: not indicated; caffeine use is not excessive or problematic     Does Patient Exercise: yes     Type of exercise: walking     Exercise (avg: min/session): 30-60     Times/week: 3  Hep-HIV-STD-Contraception     Sun Exposure-Excessive: rarely  Safety-Violence-Falls     Seat Belt Use: yes      Drug Use:  no.     Current Allergies (reviewed today): No known allergies  Family History: Mom alive  28 with DM, HTN, Dad with death in 61s sudden cardiac, possibly due to CAD. one sister with HTN, dm 2 brothers with HTN One sister with sudden cardiac death as well  Social History: Reviewed history from 03/13/2009 and no changes required. has partner HIV positive, but lives alone Never Smoked Alcohol use-yes Drug use-no  Review of Systems       The patient complains of depression.  The patient denies anorexia, fever, weight loss, weight gain, vision loss, decreased hearing, hoarseness, chest pain, syncope, dyspnea on exertion, peripheral edema, prolonged cough, headaches, hemoptysis, abdominal pain, melena, hematochezia, severe indigestion/heartburn, hematuria, incontinence, genital sores, muscle weakness, suspicious skin lesions, transient blindness, difficulty walking, unusual weight change, abnormal bleeding, and enlarged lymph nodes.    Vital Signs:  Patient profile:   40 year old male Height:       69 inches (175.26 cm) Weight:      184 pounds (83.64 kg) BMI:     27.27 Temp:     97.6 degrees F (36.44 degrees C) oral Pulse rate:   71 / minute BP sitting:   130 / 79  (left arm)  Vitals Entered By: Starleen Arms CMA (July 10, 2009 9:07 AM) CC: f/u labs, Lipid Management Is Patient Diabetic? No Pain Assessment Patient in pain? no      Nutritional Status BMI of 25 - 29 = overweight Nutritional Status Detail nl  Have you ever been in a relationship where you felt threatened, hurt or afraid?No   Does patient need assistance? Functional Status Self care Ambulation Normal   Physical Exam  General:  alert.  well-developed and well-nourished.   Head:  normocephalic and atraumatic.   Eyes:  vision grossly intact, pupils equal, pupils round, and pupils reactive to light.  Ears:  no external deformities.  ear piercing(s) noted.   Nose:  no external deformity.   Mouth:  pharynx pink and moist, no erythema, and no exudates.   Lungs:  normal respiratory effort, no crackles, and no wheezes.   Heart:  normal rate, regular rhythm, no murmur, no gallop, and no rub.   Abdomen:  soft, non-tender, normal bowel sounds, no distention, no masses, Msk:  normal ROM.  normal ROM.   Extremities:  no edema Neurologic:  alert & oriented X3.  strength normal in all extremities and gait normal.   Skin:  no rashes.  no ecchymoses.   Psych:  Oriented X3, memory intact for recent and remote, good eye contact, and dysphoric affect.          Medication Adherence: 07/10/2009   Adherence to medications reviewed with patient. Counseling to provide adequate adherence provided   Prevention For Positives: 07/10/2009   Safe sex practices discussed with patient. Condoms offered.   Education Materials Provided: 07/10/2009 Safe sex practices discussed with patient. Condoms offered.                          Impression & Recommendations:  Problem # 1:  HIV INFECTION (ICD-042) Assessment  Improved Excellent control! Orders: Est. Patient Level IV (99214)Future Orders: T-HIV Viral Load ([id number]) ... 10/08/2009 T-CD4SP ([facility]) (CD4SP) ... 10/08/2009 T-CBC w/Diff ([id number]) ... 10/08/2009 T-Comprehensive Metabolic Panel [id number]) ... 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 May consider changing to statin with better effect on TG or addign niacin. His updated medication list for this problem includes:    Pravachol 10 Mg Tabs (Pravastatin sodium) .Marland Kitchen... Take 1 tablet by mouth once a day    Lovaza 1 Gm Caps (Omega-3-acid ethyl esters) .Marland Kitchen... Take 1 tablet by mouth two times a day  Orders: Est. Patient Level IV (24401)  Problem # 3:  MOURNING (ICD-309.0)  COunselled, offered support. At least 20 minutes of counselling and supportive therapy given to pt in face to face time.  Orders: Est. Patient Level IV (02725)  Problem # 4:  CHRONIC KIDNEY DISEASE UNSPECIFIED (ICD-585.9)  cr stable  Orders: Est. Patient Level IV (36644)  Problem # 5:  ESSENTIAL HYPERTENSION, BENIGN (ICD-401.1)  Continue current medications. His updated medication list for this problem includes:    Lisinopril 40 Mg Tabs (Lisinopril) .Marland Kitchen... Take 1 tablet by mouth once a day    Norvasc 10 Mg Tabs (Amlodipine besylate) .Marland Kitchen... Take 1 tablet by mouth once a day    Lopressor 100 Mg Tabs (Metoprolol tartrate) .Marland Kitchen... Take 1 tablet by mouth two times a day  BP today: 130/79 Prior BP: 145/93 (03/13/2009)  Prior 10 Yr Risk Heart Disease: 4 % (08/29/2008)  Labs Reviewed: K+:  4.0 (06/28/2009) Creat: : 1.72 (06/28/2009)   Chol: 156 (06/28/2009)   HDL: 21 (06/28/2009)   LDL: 57 (06/28/2009)   TG: 389 (06/28/2009)  Orders: Est. Patient Level IV (03474)  Lipid Assessment/Plan:      Based on NCEP/ATP III, the patient's risk factor category is "2  or more risk factors and a calculated 10 year CAD risk of < 20%".  The patient's lipid goals are as follows: Total cholesterol goal is 200; LDL cholesterol goal is 130; HDL cholesterol goal is 40; Triglyceride goal is 150.  His LDL cholesterol goal has been met.     Patient Instructions: 1)  rtc in 3-4 months with Dr. Daiva Eves   Process Orders Check Orders Results:     [facility]: ABN not required for this insurance Tests Sent for requisitioning (July 11, 2009 12:13 AM):     10/08/2009: [facility] -- T-HIV Viral Load [phone] (signed)     10/08/2009: [facility] -- T-CBC w/Diff [[id number]] (signed)     10/08/2009: [facility] -- T-Comprehensive Metabolic Panel [id number] (signed)

## 2010-07-17 NOTE — Assessment & Plan Note (Signed)
Summary: f/u TY   Primary Provider:  Paulette Blanch Dam MD  CC:  follow-up visit and Lipid Management.  History of Present Illness: 40 year old AA with HIV, HTN, CKD, hyperlipidmia  on epzicom and ritonavir boosted reyataz with undetectable viral load. He feels he is coping as well as he can after loss of his sister to sudden cardiac death. His respiratory ssx have resolved since last visit. I had also checked  a  quantiferon gold that was indeterminate at that visit and chest xray that was clear. He says his wt loss was intentional. He does wish for megace which he uses approx once a month to stimjlate his appetite. I reviewed the concerns I hvae about this drug should he use it every day and long term in cluding risk for adrenal insufficiency> I spent over 45 minutes with this pt inlcuding greater than 50% of time face to face cousnelling.  Lipid Management History:      Positive NCEP/ATP III risk factors include HDL cholesterol less than 40, family history for ischemic heart disease (females less than 86 years old & males less than 38 years old), and hypertension.  Negative NCEP/ATP III risk factors include male age less than 60 years old, non-diabetic, non-tobacco-user status, no ASHD (atherosclerotic heart disease), no prior stroke/TIA, no peripheral vascular disease, and no history of aortic aneurysm.    Problems Prior to Update: 1)  Productive Cough  (ICD-786.2) 2)  Mourning  (ICD-309.0) 3)  Hypertriglyceridemia  (ICD-272.1) 4)  Proteinuria  (ICD-791.0) 5)  Health Screening  (ICD-V70.0) 6)  Testosterone Deficiency  (ICD-257.2) 7)  Condyloma Acuminatum  (ICD-078.11) 8)  Chronic Kidney Disease Unspecified  (ICD-585.9) 9)  Essential Hypertension, Benign  (ICD-401.1) 10)  HIV Infection  (ICD-042)  Medications Prior to Update: 1)  Androgel Pump 1 %  Gel (Testosterone) .... Apply Every Morning, Give A 30 Day Supply 2)  Epzicom 600-300 Mg  Tabs (Abacavir Sulfate-Lamivudine) .... Take 1  Tablet By Mouth Once A Day With Ritonavir and Atazanavir 3)  Reyataz 300 Mg  Caps (Atazanavir Sulfate) .... Take 1 Tablet By Mouth Once A Day With Ritonavir. Do Not Take Proton Pump Inhibitor, H2 Blockers or Antacids With This Medicine 4)  Norvir 100 Mg  Caps (Ritonavir) .... Take 1 Capsule By Mouth Once A Day Take With Atazanavir (Reyataz) 5)  Lisinopril 40 Mg  Tabs (Lisinopril) .... Take 1 Tablet By Mouth Once A Day 6)  Norvasc 10 Mg  Tabs (Amlodipine Besylate) .... Take 1 Tablet By Mouth Once A Day 7)  Lopressor 100 Mg  Tabs (Metoprolol Tartrate) .... Take 1 Tablet By Mouth Two Times A Day 8)  Pravachol 10 Mg  Tabs (Pravastatin Sodium) .... Take 1 Tablet By Mouth Once A Day 9)  Lovaza 1 Gm Caps (Omega-3-Acid Ethyl Esters) .... Take 1 Tablet By Mouth Two Times A Day 10)  Marinol 2.5 Mg Caps (Dronabinol) .... Take 1 Tablet By Mouth Two Times As Needed For Appetite 11)  Avelox 400 Mg Tabs (Moxifloxacin Hcl) .... Take 1 Tablet By Mouth Once A Day For 7 Days 12)  Clonidine Hcl 0.1 Mg Tabs (Clonidine Hcl) .... Take 1 Tablet By Mouth Two Times A Day  Current Medications (verified): 1)  Androgel Pump 1 %  Gel (Testosterone) .... Apply Every Morning, Give A 30 Day Supply 2)  Epzicom 600-300 Mg  Tabs (Abacavir Sulfate-Lamivudine) .... Take 1 Tablet By Mouth Once A Day With Ritonavir and Atazanavir 3)  Reyataz 300 Mg  Caps (Atazanavir Sulfate) .... Take 1 Tablet By Mouth Once A Day With Ritonavir. Do Not Take Proton Pump Inhibitor, H2 Blockers or Antacids With This Medicine 4)  Norvir 100 Mg  Caps (Ritonavir) .... Take 1 Capsule By Mouth Once A Day Take With Atazanavir (Reyataz) 5)  Lisinopril 40 Mg  Tabs (Lisinopril) .... Take 1 Tablet By Mouth Once A Day 6)  Norvasc 10 Mg  Tabs (Amlodipine Besylate) .... Take 1 Tablet By Mouth Once A Day 7)  Lopressor 100 Mg  Tabs (Metoprolol Tartrate) .... Take 1 Tablet By Mouth Two Times A Day 8)  Lovaza 1 Gm Caps (Omega-3-Acid Ethyl Esters) .... Take 1 Tablet By  Mouth Two Times A Day 9)  Clonidine Hcl 0.1 Mg Tabs (Clonidine Hcl) .... Take 1 Tablet By Mouth Two Times A Day 10)  Pravachol 20 Mg Tabs (Pravastatin Sodium) .... Take 1 Tablet By Mouth Once A Day 11)  Megace Es 625 Mg/10ml Susp (Megestrol Acetate) .... Take 5ml Per Day When Needed For Appettit, Disp One Month  Allergies (verified): No Known Drug Allergies    Preventive Screening-Counseling & Management  Alcohol-Tobacco     Alcohol drinks/day: 0     Smoking Status: never  Caffeine-Diet-Exercise     Caffeine use/day: sodas occassionally     Caffeine Counseling: not indicated; caffeine use is not excessive or problematic     Does Patient Exercise: yes     Type of exercise: walking     Exercise (avg: min/session): 30-60     Times/week: 3  Safety-Violence-Falls     Seat Belt Use: yes   Current Allergies (reviewed today): No known allergies  Past History:  Past Medical History: Last updated: 03/13/2009 HIV HTN CKD Hypogonadism:--low testosterone Anal warts claims he gained a lot of weight while taking megace  in manner sound like possible cushingoid effect. He currenlly only takes intermittently  Past Surgical History: Last updated: 08/29/2008 ANal warts excited  Family History: Last updated: 07/10/2009 Mom alive  54 with DM, HTN, Dad with death in 88s sudden cardiac, possibly due to CAD. one sister with HTN, dm 2 brothers with HTN One sister with sudden cardiac death as well  Social History: Last updated: 03/13/2009 has partner HIV positive, but lives alone Never Smoked Alcohol use-yes Drug use-no  Risk Factors: Alcohol Use: 0 (01/22/2010) Caffeine Use: sodas occassionally (01/22/2010) Exercise: yes (01/22/2010)  Risk Factors: Smoking Status: never (01/22/2010)  Family History: Reviewed history from 07/10/2009 and no changes required. Mom alive  27 with DM, HTN, Dad with death in 8s sudden cardiac, possibly due to CAD. one sister with HTN, dm 2 brothers  with HTN One sister with sudden cardiac death as well  Social History: Reviewed history from 03/13/2009 and no changes required. has partner HIV positive, but lives alone Never Smoked Alcohol use-yes Drug use-no  Review of Systems  The patient denies anorexia, fever, weight loss, weight gain, vision loss, decreased hearing, hoarseness, chest pain, syncope, dyspnea on exertion, peripheral edema, prolonged cough, headaches, hemoptysis, abdominal pain, melena, hematochezia, severe indigestion/heartburn, hematuria, incontinence, genital sores, muscle weakness, suspicious skin lesions, transient blindness, difficulty walking, depression, unusual weight change, abnormal bleeding, and enlarged lymph nodes.    Vital Signs:  Patient profile:   40 year old male Height:      69 inches (175.26 cm) Weight:      176.4 pounds (80.18 kg) BMI:     26.14 Temp:     98.6 degrees F (37.00 degrees C) oral Pulse rate:  73 / minute BP sitting:   129 / 84  (left arm)  Vitals Entered By: Kathi Simpers [facility]) (January 22, 2010 2:58 PM) CC: follow-up visit, Lipid Management Pain Assessment Patient in pain? no      Nutritional Status BMI of 25 - 29 = overweight Nutritional Status Detail appetite is okay per patient  Have you ever been in a relationship where you felt threatened, hurt or afraid?No   Does patient need assistance? Functional Status Self care Ambulation Normal        Medication Adherence: 01/22/2010   Adherence to medications reviewed with patient. Counseling to provide adequate adherence provided                                Physical Exam  General:  alert.  well-developed and well-nourished.   Head:  normocephalic and atraumatic.   Eyes:  vision grossly intact, pupils equal, pupils round, and pupils reactive to light.   Ears:  no external deformities.  ear piercing(s) noted.   Nose:  no external deformity.  no external erythema, no nasal discharge, and no mucosal  edema.   Mouth:  pharynx pink and moist, no erythema, and no exudates.   Neck:  supple and full ROM.   Lungs:  normal respiratory effort, no crackles, and no wheezes.   Heart:  normal rate, regular rhythm, no murmur, no gallop, and no rub.   Abdomen:  soft, non-tender, normal bowel sounds, no distention, no masses, Msk:  normal ROM.  normal ROM.   Extremities:  No clubbing, cyanosis, edema, or deformity noted with normal full range of motion of all joints.   Neurologic:  alert & oriented X3.  strength normal in all extremities and gait normal.   Skin:  no rashes.  no ecchymoses.   Psych:  Oriented X3, memory intact for recent and remote,     Impression & Recommendations:  Problem # 1:  HIV INFECTION (ICD-042) Excellent control! The following medications were removed from the medication list:    Avelox 400 Mg Tabs (Moxifloxacin hcl) .Marland Kitchen... Take 1 tablet by mouth once a day for 7 days  Orders: Est. Patient Level V (99215)Future Orders: T-CD4SP ([facility]) (CD4SP) ... 05/07/2010 T-HIV Viral Load [phone]) ... 05/07/2010 T-CBC w/Diff ([id number]) ... 05/07/2010 T-Comprehensive Metabolic Panel [phone]) ... 05/07/2010 T-Lipid Profile [phone]) ... 05/07/2010  Diagnostics Reviewed:  HIV: CDC-defined AIDS (01/29/2008)   HIV-Western blot: Positive (05/11/2007)   CD4: 480 (12/28/2009)   WBC: 4.6 (12/27/2009)   Hgb: 14.8 (12/27/2009)   HCT: 43.1 (12/27/2009)   Platelets: 244 (12/27/2009) HIV genotype: See Comment (12/22/2008)   HIV-1 RNA: <48 copies/mL (12/27/2009)   HBSAg: NEG (05/11/2007)  Problem # 2:  MOURNING (ICD-309.0)  doing better.  Orders: Est. Patient Level V (40102)  Problem # 3:  TESTOSTERONE DEFICIENCY (ICD-257.2)  Refilled testosterone for pt  Orders: Est. Patient Level V (72536)  Problem # 4:  CHRONIC KIDNEY DISEASE UNSPECIFIED (ICD-585.9)  cr stable  Orders: Est. Patient Level V (64403)  Problem # 5:  ESSENTIAL HYPERTENSION, BENIGN  (ICD-401.1)  bp decently controlled His updated medication list for this problem includes:    Lisinopril 40 Mg Tabs (Lisinopril) .Marland Kitchen... Take 1 tablet by mouth once a day    Norvasc 10 Mg Tabs (Amlodipine besylate) .Marland Kitchen... Take 1 tablet by mouth once a day    Lopressor 100 Mg Tabs (Metoprolol tartrate) .Marland Kitchen... Take 1 tablet by mouth two times  a day    Clonidine Hcl 0.1 Mg Tabs (Clonidine hcl) .Marland Kitchen... Take 1 tablet by mouth two times a day  Orders: Est. Patient Level V (16109)  Problem # 6:  HYPERTRIGLYCERIDEMIA (ICD-272.1)  increase pravachol The following medications were removed from the medication list:    Pravachol 10 Mg Tabs (Pravastatin sodium) .Marland Kitchen... Take 1 tablet by mouth once a day His updated medication list for this problem includes:    Lovaza 1 Gm Caps (Omega-3-acid ethyl esters) .Marland Kitchen... Take 1 tablet by mouth two times a day    Pravachol 20 Mg Tabs (Pravastatin sodium) .Marland Kitchen... Take 1 tablet by mouth once a day  Orders: Est. Patient Level V (60454)  Medications Added to Medication List This Visit: 1)  Pravachol 20 Mg Tabs (Pravastatin sodium) .... Take 1 tablet by mouth once a day 2)  Megace Es 625 Mg/63ml Susp (Megestrol acetate) .... Take 5ml per day when needed for appettit, disp one month  Lipid Assessment/Plan:      Based on NCEP/ATP III, the patient's risk factor category is "0-1 risk factors".  The patient's lipid goals are as follows: Total cholesterol goal is 200; LDL cholesterol goal is 130; HDL cholesterol goal is 40; Triglyceride goal is 150.  His LDL cholesterol goal has been met.    Patient Instructions: 1)  rtc in December   Prescriptions: ANDROGEL PUMP 1 %  GEL (TESTOSTERONE) apply every morning, give a 30 day supply  #150 x 0   Entered and Authorized by:   Acey Lav MD   Signed by:   Paulette Blanch Dam MD on 01/22/2010   Method used:   Print then Give to Patient   RxID:   [id number] CLONIDINE HCL 0.1 MG TABS (CLONIDINE HCL) Take 1 tablet by mouth  two times a day  #60 x 11   Entered and Authorized by:   Acey Lav MD   Signed by:   Paulette Blanch Dam MD on 01/22/2010   Method used:   Electronically to        [facility]  [phone]* (retail)       [address] [place], [place]  [address]       Ph: [phone] or [phone]       Fax: [phone]   RxID:   [id number] LOVAZA 1 GM CAPS (OMEGA-3-ACID ETHYL ESTERS) Take 1 tablet by mouth two times a day  #62 x 11   Entered and Authorized by:   Acey Lav MD   Signed by:   Paulette Blanch Dam MD on 01/22/2010   Method used:   Electronically to        [facility]  [phone]* (retail)       [address] [place], [place]  [address]       Ph: [phone] or [phone]       Fax: [phone]   RxID:   [id number] LOPRESSOR 100 MG  TABS (METOPROLOL TARTRATE) Take 1 tablet by mouth two times a day  #62 Tablet x 11   Entered and Authorized by:   Acey Lav MD   Signed by:   Paulette Blanch Dam MD on 01/22/2010   Method used:   Electronically to        [facility]  [phone]* (retail)       [address] [place], [place]  [address]  Ph: [phone] or [phone]       Fax: [phone]   RxID:   [id number] NORVASC 10 MG  TABS (AMLODIPINE BESYLATE) Take 1 tablet by mouth once a day  #31 x 11   Entered and Authorized by:   Acey Lav MD   Signed by:   Paulette Blanch Dam MD on 01/22/2010   Method used:   Electronically to        [facility]  [phone]* (retail)       [address] [place], [place]  [address]       Ph: [phone] or [phone]       Fax: [phone]   RxID:   [id number] LISINOPRIL 40 MG  TABS (LISINOPRIL) Take 1 tablet by mouth once a day  #31 Tablet x 11   Entered and Authorized by:   Acey Lav MD   Signed by:   Paulette Blanch Dam MD on 01/22/2010   Method used:   Electronically to        [facility]  [phone]* (retail)       [address] [place], [place]  [address]       Ph: [phone] or [phone]       Fax: [phone]   RxID:   [id number] NORVIR 100 MG  CAPS (RITONAVIR) Take 1 capsule by mouth once a day take with atazanavir (reyataz)  #30 Capsule x 10   Entered and Authorized by:   Acey Lav MD   Signed by:   Paulette Blanch Dam MD on 01/22/2010   Method used:   Electronically to        [facility]  [phone]* (retail)       [address] [place], [place]  [address]       Ph: [phone] or [phone]       Fax: [phone]   RxID:   [id number] REYATAZ 300 MG  CAPS (ATAZANAVIR SULFATE) Take 1 tablet by mouth once a day with ritonavir. DO NOT take proton pump inhibitor, H2 blockers or antacids with this medicine  #30 Capsule x 10   Entered and Authorized by:   Acey Lav MD   Signed by:   Paulette Blanch Dam MD on 01/22/2010   Method used:   Electronically to        [facility]  [phone]* (retail)       [address] [place], [place]  [address]       Ph: [phone] or [phone]       Fax: [phone]   RxID:   [id number] EPZICOM 600-300 MG  TABS (ABACAVIR SULFATE-LAMIVUDINE) Take 1 tablet by mouth once a day with ritonavir and atazanavir  #30 Tablet x 11   Entered and Authorized by:   Acey Lav MD   Signed by:   Paulette Blanch Dam MD on 01/22/2010   Method used:   Electronically to        [facility]  [phone]* (retail)       [address] [place], [place]  [address]       Ph: [phone] or [phone]       Fax: [phone]   RxID:   [id number] MEGACE ES 625 MG/5ML SUSP (MEGESTROL ACETATE) take 5ml per day when needed for appettit, disp  one month  #30 x 5   Entered and Authorized by:   Acey Lav MD   Signed by:   Paulette Blanch Dam MD on 01/22/2010   Method used:   Electronically to        [facility]  [phone]* (retail)       [address] [place], [place]  [address]       Ph: [phone] or [phone]        Fax: [phone]   RxID:   [id number] PRAVACHOL 20 MG TABS (PRAVASTATIN SODIUM) Take 1 tablet by mouth once a day  #30 x 11   Entered and Authorized by:   Acey Lav MD   Signed by:   Acey Lav MD on 01/22/2010   Method used:   Electronically to        [facility]  [phone]* (retail)       [address] [place], [place]  [address]       Ph: [phone] or [phone]       Fax: [phone]   RxID:   [id number]

## 2010-07-17 NOTE — Progress Notes (Signed)
Summary: Med change/ or prior authorization pending for Megace ES  Phone Note From Pharmacy   [facility] [address] [phone] Request: Needs authorization from insurer Summary of Call: Received a fax prior authorization request for MD to call insurer to process Megace ES.  Please have MD or representative to call [place] [employer] at [phone] to initiate the claim.  [facility] by [facility] or phone with the status. Patient's ID is [id number] N Initial call taken by: Paulo Fruit  BS,CPht II,MPH,  January 26, 2010 9:45 AM  Follow-up for Phone Call        [facility] Medicaid will not cover Megace ES unless patient has tried and failed Megace  regular strength first. I reviewed past dictations and don't see any record of patient on Megace regular strength.  Would you like to change the prescription for patient to try for one month or would you like me to continue with the prior authorizations? Follow-up by: Paulo Fruit  BS,CPht II,MPH,  January 26, 2010 9:56 AM  Additional Follow-up for Phone Call Additional follow up Details #1::        Lets try him on megace regular strength first. He only uses it once  a day Additional Follow-up by: Acey Lav MD,  January 29, 2010 12:05 AM    New/Updated Medications: MEGESTROL ACETATE 800 MG/20ML SUSP (MEGESTROL ACETATE) take 20ml per day, dispense one month supply Prescriptions: MEGESTROL ACETATE 800 MG/20ML SUSP (MEGESTROL ACETATE) take 20ml per day, dispense one month supply  #30 x 6   Entered and Authorized by:   Acey Lav MD   Signed by:   Paulette Blanch Dam MD on 01/29/2010   Method used:   Electronically to        [facility] (retail)       [address]       [place], [place]  [address]       Ph: [phone]       Fax: [phone]   RxID:   [id number]   Appended Document: Med change/ or prior authorization pending for Megace ES sent to wrong pharmacy.  Rx canceled at [facility] and  called [facility]

## 2010-07-17 NOTE — Miscellaneous (Signed)
Summary: Orders Update  Clinical Lists Changes  Orders: Added new Test order of T-CD4SP ([facility]) (CD4SP) - Signed 

## 2010-07-18 ENCOUNTER — Encounter (INDEPENDENT_AMBULATORY_CARE_PROVIDER_SITE_OTHER): Payer: Self-pay | Admitting: *Deleted

## 2010-07-19 NOTE — Assessment & Plan Note (Signed)
Summary: mucus and blood present in stool/tkk   Primary Provider:  Paulette Blanch Dam MD  CC:  pt. c/o blood and mucos in stool x 3 days and B/P elevated pt. states he could not tolerate Clonidine.  History of Present Illness: 40 year old AA with HIV, HTN, CKD, hyperlipidmia, on EPZ/ATVr with CD4 510 and VL <20 (05-07-10). Over last 2-3 days has noted jelly like consistency of stool and small amts of blood. This am had episode where he passed gas and passed mucous as well.  No dizzyness or lightheadedness. No hx of hemmerhoids; has had anal warts prior.     Preventive Screening-Counseling & Management  Alcohol-Tobacco     Alcohol drinks/day: 0     Smoking Status: never  Caffeine-Diet-Exercise     Caffeine use/day: sodas occassionally     Caffeine Counseling: not indicated; caffeine use is not excessive or problematic     Does Patient Exercise: yes     Type of exercise: walking     Exercise (avg: min/session): 30-60     Times/week: 3  Hep-HIV-STD-Contraception     Sun Exposure-Excessive: rarely  Safety-Violence-Falls     Seat Belt Use: yes      Drug Use:  no.    Comments: pt. declined condoms   Updated Prior Medication List: ANDROGEL PUMP 1 %  GEL (TESTOSTERONE) apply every morning, give a 30 day supply EPZICOM 600-300 MG  TABS (ABACAVIR SULFATE-LAMIVUDINE) Take 1 tablet by mouth once a day with ritonavir and atazanavir REYATAZ 300 MG  CAPS (ATAZANAVIR SULFATE) Take 1 tablet by mouth once a day with ritonavir. DO NOT take proton pump inhibitor, H2 blockers or antacids with this medicine NORVIR 100 MG  CAPS (RITONAVIR) Take 1 capsule by mouth once a day take with atazanavir (reyataz) LISINOPRIL 40 MG  TABS (LISINOPRIL) Take 1 tablet by mouth once a day NORVASC 10 MG  TABS (AMLODIPINE BESYLATE) Take 1 tablet by mouth once a day LOPRESSOR 100 MG  TABS (METOPROLOL TARTRATE) Take 1 tablet by mouth two times a day LOVAZA 1 GM CAPS (OMEGA-3-ACID ETHYL ESTERS) take two tablets two  times a day PRAVACHOL 20 MG TABS (PRAVASTATIN SODIUM) Take 1 tablet by mouth once a day MEGESTROL ACETATE 800 MG/20ML SUSP (MEGESTROL ACETATE) take 20ml per day, dispense one month supply  Current Allergies (reviewed today): No known allergies  Past History:  Past medical, surgical, family and social histories (including risk factors) reviewed, and no changes noted (except as noted below).  Past Medical History: HIV HTN CKD Hypogonadism:--low testosterone Anal warts- resected August 2009 claims he gained a lot of weight while taking megace  in manner sound like possible cushingoid effect. He currenlly only takes intermittently Headaches  Past Surgical History: Reviewed history from 08/29/2008 and no changes required. ANal warts excited  Family History: Reviewed history from 07/10/2009 and no changes required. Mom alive  30 with DM, HTN, Dad with death in 71s sudden cardiac, possibly due to CAD. one sister with HTN, dm 2 brothers with HTN One sister with sudden cardiac death as well  Social History: Reviewed history from 03/13/2009 and no changes required. has partner HIV positive, but lives alone Never Smoked Alcohol use-yes Drug use-no  Review of Systems       no new sex partners. wt up 1.8 #  Vital Signs:  Patient profile:   40 year old male Height:      69 inches (175.26 cm) Weight:      175.8 pounds (79.91 kg)  BMI:     26.05 Temp:     98.2 degrees F (36.78 degrees C) oral Pulse rate:   80 / minute BP sitting:   157 / 97  (left arm)  Vitals Entered By: Wendall Mola CMA [facility]) (July 03, 2010 10:51 AM) CC: pt. c/o blood and mucos in stool x 3 days, B/P elevated pt. states he could not tolerate Clonidine Is Patient Diabetic? No Pain Assessment Patient in pain? no      Nutritional Status BMI of 25 - 29 = overweight Nutritional Status Detail appetite "normal"  Have you ever been in a relationship where you felt threatened, hurt or afraid?No     Does patient need assistance? Functional Status Self care Ambulation Normal Comments one missed dose of HAART meds since last visit   Physical Exam  General:  well-developed, well-nourished, and well-hydrated.   Eyes:  pupils equal, pupils round, and pupils reactive to light.   Mouth:  pharynx pink and moist and no exudates.   Neck:  no masses.   Lungs:  normal respiratory effort and normal breath sounds.   Heart:  normal rate, regular rhythm, and no murmur.   Rectal:  multiple warts, seen externally, palapable internally. No mucus or blood on digital exam. anal warts.     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 WIll send urine and rectal GC, chlamydia, RPR and hold on further w/u til these tests are resulted. If negative, will have him eval by GI surgeon.   Orders: T- * Misc. Laboratory test [phone])  Problem # 2:  HIV INFECTION (ICD-042) will recheck his labs and his yearly RPR and lipids. f/u with Dr Daiva Eves as scheduled.  Orders: T- * Misc. Laboratory test [phone]) T-CD4SP [facility]) (CD4SP) T-HIV Viral Load [id number]) T-Lipid Profile [phone]) T-RPR (Syphilis) [phone]) Est. Patient Level IV (28413) T-GC Probe, urine [phone]) T-Chlamydia  Probe, urine [phone])         Medication Adherence: 07/03/2010   Adherence to medications reviewed with patient. Counseling to provide adequate adherence provided   Prevention For Positives: 06/26/2010   Safe sex practices discussed with patient. Condoms offered.                             Appended Document: Orders Update    Clinical Lists Changes  Orders: Added new Referral order of Surgical Referral (Surgery) - Signed

## 2010-07-19 NOTE — Assessment & Plan Note (Signed)
Summary: F/U [Carlos Hughes]   Primary Provider:  Paulette Blanch Dam MD  CC:  f/u, "head feels funny" discomfort at night, and pressure at night.  History of Present Illness: 40 year old AA with HIV, HTN, CKD, hyperlipidmia  on epzicom and ritonavir boosted reyataz with undetectable viral load. Today he comes in having noticed a headache. He had Pain across the top of his head starting on Thanksgiving, felt dizziness with standing up with feeling of disequilibrium. In the past week now eye painboth eyes, worse with looking at laptop, then just over the left eye with ear pain. He denies pain worse with light, strong smells loud noises. He denies nausea or vomiting.   I spent over 45 minutes with this pt inlcuding greater than 50% of time face to face cousnelling.  Problems Prior to Update: 1)  Productive Cough  (ICD-786.2) 2)  Mourning  (ICD-309.0) 3)  Hypertriglyceridemia  (ICD-272.1) 4)  Proteinuria  (ICD-791.0) 5)  Health Screening  (ICD-V70.0) 6)  Testosterone Deficiency  (ICD-257.2) 7)  Condyloma Acuminatum  (ICD-078.11) 8)  Chronic Kidney Disease Unspecified  (ICD-585.9) 9)  Essential Hypertension, Benign  (ICD-401.1) 10)  HIV Infection  (ICD-042)  Medications Prior to Update: 1)  Androgel Pump 1 %  Gel (Testosterone) .... Apply Every Morning, Give A 30 Day Supply 2)  Epzicom 600-300 Mg  Tabs (Abacavir Sulfate-Lamivudine) .... Take 1 Tablet By Mouth Once A Day With Ritonavir and Atazanavir 3)  Reyataz 300 Mg  Caps (Atazanavir Sulfate) .... Take 1 Tablet By Mouth Once A Day With Ritonavir. Do Not Take Proton Pump Inhibitor, H2 Blockers or Antacids With This Medicine 4)  Norvir 100 Mg  Caps (Ritonavir) .... Take 1 Capsule By Mouth Once A Day Take With Atazanavir (Reyataz) 5)  Lisinopril 40 Mg  Tabs (Lisinopril) .... Take 1 Tablet By Mouth Once A Day 6)  Norvasc 10 Mg  Tabs (Amlodipine Besylate) .... Take 1 Tablet By Mouth Once A Day 7)  Lopressor 100 Mg  Tabs (Metoprolol Tartrate) .... Take 1  Tablet By Mouth Two Times A Day 8)  Lovaza 1 Gm Caps (Omega-3-Acid Ethyl Esters) .... Take 1 Tablet By Mouth Two Times A Day 9)  Clonidine Hcl 0.1 Mg Tabs (Clonidine Hcl) .... Take 1 Tablet By Mouth Two Times A Day 10)  Pravachol 20 Mg Tabs (Pravastatin Sodium) .... Take 1 Tablet By Mouth Once A Day 11)  Megestrol Acetate 800 Mg/11ml Susp (Megestrol Acetate) .... Take 20ml Per Day, Dispense One Month Supply  Current Medications (verified): 1)  Androgel Pump 1 %  Gel (Testosterone) .... Apply Every Morning, Give A 30 Day Supply 2)  Epzicom 600-300 Mg  Tabs (Abacavir Sulfate-Lamivudine) .... Take 1 Tablet By Mouth Once A Day With Ritonavir and Atazanavir 3)  Reyataz 300 Mg  Caps (Atazanavir Sulfate) .... Take 1 Tablet By Mouth Once A Day With Ritonavir. Do Not Take Proton Pump Inhibitor, H2 Blockers or Antacids With This Medicine 4)  Norvir 100 Mg  Caps (Ritonavir) .... Take 1 Capsule By Mouth Once A Day Take With Atazanavir (Reyataz) 5)  Lisinopril 40 Mg  Tabs (Lisinopril) .... Take 1 Tablet By Mouth Once A Day 6)  Norvasc 10 Mg  Tabs (Amlodipine Besylate) .... Take 1 Tablet By Mouth Once A Day 7)  Lopressor 100 Mg  Tabs (Metoprolol Tartrate) .... Take 1 Tablet By Mouth Two Times A Day 8)  Lovaza 1 Gm Caps (Omega-3-Acid Ethyl Esters) .... Take 1 Tablet By Mouth Two Times A Day  9)  Clonidine Hcl 0.1 Mg Tabs (Clonidine Hcl) .... Take 1 Tablet By Mouth Two Times A Day 10)  Pravachol 20 Mg Tabs (Pravastatin Sodium) .... Take 1 Tablet By Mouth Once A Day 11)  Megestrol Acetate 800 Mg/37ml Susp (Megestrol Acetate) .... Take 20ml Per Day, Dispense One Month Supply  Allergies (verified): No Known Drug Allergies   Preventive Screening-Counseling & Management  Alcohol-Tobacco     Alcohol drinks/day: 0     Smoking Status: never   Current Allergies (reviewed today): No known allergies  Past History:  Past Medical History: HIV HTN CKD Hypogonadism:--low testosterone Anal warts claims he  gained a lot of weight while taking megace  in manner sound like possible cushingoid effect. He currenlly only takes intermittently Headaches  Family History: Reviewed history from 07/10/2009 and no changes required. Mom alive  4 with DM, HTN, Dad with death in 52s sudden cardiac, possibly due to CAD. one sister with HTN, dm 2 brothers with HTN One sister with sudden cardiac death as well  Social History: Reviewed history from 03/13/2009 and no changes required. has partner HIV positive, but lives alone Never Smoked Alcohol use-yes Drug use-no  Review of Systems       The patient complains of headaches.  The patient denies anorexia, fever, weight loss, weight gain, vision loss, decreased hearing, hoarseness, chest pain, syncope, dyspnea on exertion, peripheral edema, prolonged cough, hemoptysis, abdominal pain, melena, hematochezia, severe indigestion/heartburn, hematuria, incontinence, genital sores, muscle weakness, suspicious skin lesions, transient blindness, difficulty walking, depression, unusual weight change, abnormal bleeding, enlarged lymph nodes, and angioedema.    Vital Signs:  Patient profile:   40 year old male Height:      69 inches (175.26 cm) Weight:      174 pounds (79.09 kg) BMI:     25.79 Temp:     98.1 degrees F (36.72 degrees C) oral Pulse rate:   69 / minute BP sitting:   152 / 95  (left arm)  Vitals Entered By: Starleen Arms CMA (May 29, 2010 10:17 AM) CC: f/u, "head feels funny" discomfort at night, pressure at night Is Patient Diabetic? No Pain Assessment Patient in pain? no      Nutritional Status BMI of 25 - 29 = overweight Nutritional Status Detail nl  Does patient need assistance? Functional Status Self care Ambulation Normal   Physical Exam  General:  alert.  well-developed and well-nourished.   Head:  normocephalic and atraumatic.   Eyes:  vision grossly intact, pupils equal, pupils round, and pupils reactive to light.  visual  filed grossly intact Ears:  left external canal with soft tissue prominence, no vesicles, TM clear. right with was. no external deformities.  ear piercing(s) noted.   Nose:  no external deformity.  no external erythema, no nasal discharge, and no mucosal edema.   Mouth:  pharynx pink and moist, no erythema, and no exudates.   Neck:  supple and full ROM.   Lungs:  normal respiratory effort, no crackles, and no wheezes.   Heart:  normal rate, regular rhythm, no murmur, no gallop, and no rub.   Abdomen:  soft, non-tender, normal bowel sounds, no distention, no masses, Msk:  normal ROM.  normal ROM.   Extremities:  No clubbing, cyanosis, edema, or deformity noted with normal full range of motion of all joints.   Neurologic:  alert & oriented X3.  strength normal in all extremities and gait normal.   Skin:  no rashes.  no ecchymoses.  Psych:  Oriented X3, memory intact for recent and remote,  slightly dyphoric        Medication Adherence: 05/29/2010   Adherence to medications reviewed with patient. Counseling to provide adequate adherence provided   Prevention For Positives: 05/29/2010   Safe sex practices discussed with patient. Condoms offered.   Education Materials Provided: 05/29/2010 Safe sex practices discussed with patient. Condoms offered.                          Impression & Recommendations:  Problem # 1:  HIV INFECTION (ICD-042) Superb control. Will consider changing him to non PI regimen to get norvir out of regimen Orders: Est. Patient Level V (99215)Future Orders: T-CD4SP ([facility]) (CD4SP) ... 08/27/2010 T-HIV Viral Load [phone]) ... 08/27/2010 T-CBC w/Diff ([id number]) ... 08/27/2010 T-Lipid Profile [phone]) ... 08/27/2010 T-RPR (Syphilis) [id number]) ... 08/27/2010 T-Comprehensive Metabolic Panel [phone]) ... 08/27/2010  Problem # 2:  HEADACHE (ICD-784.0) Assessment: New  Could be due to his BP being elevate . Could be migrainous. Will have  him start the clonidine rx at last visit. If this fails to help his headache, could consider triptans though some risk of BP elevation with them, as well as with ergot. Could give him low dose narcotic if necessary. Could add in a tricyclic antidepressant. He is already on beta blocker which is very good for prophylaxing vs migraines. If ha not better will also refer to Neurology His updated medication list for this problem includes:    Lopressor 100 Mg Tabs (Metoprolol tartrate) .Marland Kitchen... Take 1 tablet by mouth two times a day  Orders: Est. Patient Level V (43329)  Problem # 3:  ESSENTIAL HYPERTENSION, BENIGN (ICD-401.1)  asked to start hte clonidine His updated medication list for this problem includes:    Lisinopril 40 Mg Tabs (Lisinopril) .Marland Kitchen... Take 1 tablet by mouth once a day    Norvasc 10 Mg Tabs (Amlodipine besylate) .Marland Kitchen... Take 1 tablet by mouth once a day    Lopressor 100 Mg Tabs (Metoprolol tartrate) .Marland Kitchen... Take 1 tablet by mouth two times a day    Clonidine Hcl 0.1 Mg Tabs (Clonidine hcl) .Marland Kitchen... Take 1 tablet by mouth two times a day  Orders: Est. Patient Level V (51884)  Problem # 4:  DIZZINESS (ICD-780.4)  not orthostatic, wonder if this sensatoin came form high bp, or anxiety, asked him to buy cuff and monitor bp at home  Orders: Est. 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 Will consider tricyclic  Orders: Est. Patient Level V (30160)  Problem # 6:  HYPERTRIGLYCERIDEMIA (ICD-272.1)  increase fish oil for now, may increase his pravachol if this doesnt work His updated medication list for this problem includes:    Lovaza 1 Gm Caps (Omega-3-acid ethyl esters) .Marland Kitchen... Take two tablets two times a day    Pravachol 20 Mg Tabs (Pravastatin sodium) .Marland Kitchen... Take 1 tablet by mouth once a day  Orders: Est. Patient Level V (10932)  Problem # 7:  CHRONIC KIDNEY DISEASE UNSPECIFIED  (ICD-585.9)  stable  Orders: Est. Patient Level V (35573)  Medications Added to Medication List This Visit: 1)  Lovaza 1 Gm Caps (Omega-3-acid ethyl esters) .... Take two tablets two times a day  Other Orders: Influenza Vaccine NON MCR (22025)  Patient Instructions: 1)  Please schedule a follow-up appointment in 3 months. 2)  Advised not to eat any  food or drink any liquids after 10 PM the night before labs 3)  Be sure to return for lab work one (1) week before your next appointment as scheduled.  Prescriptions: LOVAZA 1 GM CAPS (OMEGA-3-ACID ETHYL ESTERS) take two tablets two times a day  #120 x 11   Entered and Authorized by:   Acey Lav MD   Signed by:   Paulette Blanch Dam MD on 05/29/2010   Method used:   Telephoned to ...       [facility]  [phone]* (retail)       [address] [place], [place]  [address]       Ph: [phone] or [phone]       Fax: [phone]   RxID:   [id number] CLONIDINE HCL 0.1 MG TABS (CLONIDINE HCL) Take 1 tablet by mouth two times a day  #60 x 11   Entered by:   Starleen Arms CMA   Authorized by:   Acey Lav MD   Signed by:   Paulette Blanch Dam MD on 05/29/2010   Method used:   Telephoned to ...       [facility]  [phone]* (retail)       [address] [place], [place]  [address]       Ph: [phone] or [phone]       Fax: [phone]   RxID:   [id number] ANDROGEL PUMP 1 %  GEL (TESTOSTERONE) apply every morning, give a 30 day supply  #150 x 5   Entered by:   Starleen Arms CMA   Authorized by:   Acey Lav MD   Signed by:   Paulette Blanch Dam MD on 05/29/2010   Method used:   Telephoned to ...       [facility]  [phone]* (retail)       [address] [place], [place]  [address]       Ph: [phone] or [phone]       Fax: [phone]   RxID:   [id number]    Immunizations Administered:  Influenza Vaccine # 1:    Vaccine Type: Fluvax  Non-MCR    Site: left deltoid    Mfr: novartis    Dose: 0.5 ml    Route: IM    Given by: Starleen Arms CMA    Exp. Date: 09/16/2010    Lot #: [id number]    VIS given: 01/09/10 version given May 29, 2010.  Flu Vaccine Consent Questions:    Do you have a history of severe allergic reactions to this vaccine? no    Any prior history of allergic reactions to egg and/or gelatin? no    Do you have a sensitivity to the preservative Thimersol? no    Do you have a past history of Guillan-Barre Syndrome? no    Do you currently have an acute febrile illness? no    Have you ever had a severe reaction to latex? no    Vaccine information given and explained to patient? yes

## 2010-07-25 ENCOUNTER — Encounter (INDEPENDENT_AMBULATORY_CARE_PROVIDER_SITE_OTHER): Payer: Self-pay | Admitting: *Deleted

## 2010-07-25 NOTE — Miscellaneous (Signed)
  Clinical Lists Changes  Observations: Added new observation of INCOMESOURCE: UNKNOWN (07/18/2010 12:12) Added new observation of HOUSEINCOME: 0  (07/18/2010 12:12) Added new observation of #CHILD<18 IN: Unknown  (07/18/2010 12:12) Added new observation of FAMILYSIZE: 1  (07/18/2010 12:12) Added new observation of HOUSING: Unknown  (07/18/2010 12:12) Added new observation of YEARLYEXPEN: 0  (07/18/2010 12:12)

## 2010-08-02 NOTE — Miscellaneous (Signed)
  Clinical Lists Changes 

## 2010-08-28 LAB — GLUCOSE, CAPILLARY: Glucose-Capillary: 76 mg/dL (ref 70–99)

## 2010-08-28 LAB — T-HELPER CELL (CD4) - (RCID CLINIC ONLY): CD4 % Helper T Cell: 33 % (ref 33–55)

## 2010-09-02 LAB — T-HELPER CELL (CD4) - (RCID CLINIC ONLY)
CD4 % Helper T Cell: 31 % — ABNORMAL LOW (ref 33–55)
CD4 % Helper T Cell: 33 % (ref 33–55)
CD4 T Cell Abs: 480 uL (ref 400–2700)

## 2010-09-03 ENCOUNTER — Encounter: Payer: Self-pay | Admitting: *Deleted

## 2010-09-04 LAB — T-HELPER CELL (CD4) - (RCID CLINIC ONLY)
CD4 % Helper T Cell: 31 % — ABNORMAL LOW (ref 33–55)
CD4 T Cell Abs: 640 uL (ref 400–2700)

## 2010-09-13 NOTE — Miscellaneous (Signed)
Summary: RW updated  Clinical Lists Changes  Observations: Added new observation of RW VITAL STA: Active (09/03/2010 18:30)

## 2010-09-17 ENCOUNTER — Other Ambulatory Visit: Payer: Self-pay

## 2010-09-17 ENCOUNTER — Other Ambulatory Visit: Payer: Self-pay | Admitting: *Deleted

## 2010-09-17 DIAGNOSIS — B20 Human immunodeficiency virus [HIV] disease: Secondary | ICD-10-CM

## 2010-09-17 DIAGNOSIS — Z79899 Other long term (current) drug therapy: Secondary | ICD-10-CM

## 2010-09-21 LAB — T-HELPER CELL (CD4) - (RCID CLINIC ONLY)
CD4 % Helper T Cell: 30 % — ABNORMAL LOW (ref 33–55)
CD4 T Cell Abs: 560 uL (ref 400–2700)

## 2010-10-01 ENCOUNTER — Ambulatory Visit: Payer: Self-pay | Admitting: Infectious Disease

## 2010-10-02 ENCOUNTER — Other Ambulatory Visit: Payer: Self-pay | Admitting: *Deleted

## 2010-10-02 ENCOUNTER — Other Ambulatory Visit (INDEPENDENT_AMBULATORY_CARE_PROVIDER_SITE_OTHER): Payer: Medicaid Other

## 2010-10-02 ENCOUNTER — Other Ambulatory Visit: Payer: Self-pay | Admitting: Infectious Disease

## 2010-10-02 ENCOUNTER — Other Ambulatory Visit: Payer: Self-pay | Admitting: Licensed Clinical Social Worker

## 2010-10-02 DIAGNOSIS — Z79899 Other long term (current) drug therapy: Secondary | ICD-10-CM

## 2010-10-02 DIAGNOSIS — B20 Human immunodeficiency virus [HIV] disease: Secondary | ICD-10-CM

## 2010-10-02 LAB — COMPREHENSIVE METABOLIC PANEL
ALT: 12 U/L (ref 0–53)
AST: 17 U/L (ref 0–37)
CO2: 23 mEq/L (ref 19–32)
Calcium: 10.2 mg/dL (ref 8.4–10.5)
Chloride: 107 mEq/L (ref 96–112)
Creat: 1.62 mg/dL — ABNORMAL HIGH (ref 0.40–1.50)
Sodium: 139 mEq/L (ref 135–145)
Total Bilirubin: 0.6 mg/dL (ref 0.3–1.2)
Total Protein: 7.1 g/dL (ref 6.0–8.3)

## 2010-10-02 LAB — CBC WITH DIFFERENTIAL/PLATELET
Basophils Absolute: 0 10*3/uL (ref 0.0–0.1)
Lymphocytes Relative: 37 % (ref 12–46)
Lymphs Abs: 2.5 10*3/uL (ref 0.7–4.0)
Neutro Abs: 3.7 10*3/uL (ref 1.7–7.7)
Platelets: 185 10*3/uL (ref 150–400)
RBC: 4.2 MIL/uL — ABNORMAL LOW (ref 4.22–5.81)
RDW: 16.1 % — ABNORMAL HIGH (ref 11.5–15.5)
WBC: 6.8 10*3/uL (ref 4.0–10.5)

## 2010-10-03 ENCOUNTER — Ambulatory Visit: Payer: Self-pay | Admitting: Infectious Disease

## 2010-10-03 LAB — T-HELPER CELL (CD4) - (RCID CLINIC ONLY)
CD4 % Helper T Cell: 27 % — ABNORMAL LOW (ref 33–55)
CD4 T Cell Abs: 690 uL (ref 400–2700)

## 2010-10-03 LAB — HIV-1 RNA QUANT-NO REFLEX-BLD
HIV 1 RNA Quant: <20 copies/mL (ref <20)
HIV-1 RNA Quant, Log: <1.3 {Log} (ref <1.30)

## 2010-10-03 LAB — LIPID PANEL
HDL: 26 mg/dL — ABNORMAL LOW (ref >39)
LDL Cholesterol: 49 mg/dL (ref 0–99)
Total CHOL/HDL Ratio: 5.2 Ratio

## 2010-10-29 ENCOUNTER — Ambulatory Visit: Payer: Self-pay | Admitting: Infectious Disease

## 2010-10-30 NOTE — Op Note (Signed)
NAMEADAMA, Carlos Hughes               ACCOUNT NO.:  [id number]   MEDICAL RECORD NO.:  [id number]          PATIENT TYPE:  AMB   LOCATION:  [facility]                          FACILITY:  [facility]   PHYSICIAN:  Cherylynn Ridges, M.D.    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   PROCEDURES:  1. Exam under anesthesia.  2. Excision of right perianal condylomata, fulguration of perianal      condylomata, and excision of anorectal polyps.   SURGEON:  Cherylynn Ridges, MD   ANESTHESIA:  General endotracheal.   ESTIMATED BLOOD LOSS:  Less than 50 mL.   COMPLICATIONS:  None.   CONDITION:  Stable.   FINDINGS:  The patient had extensive condylomata disease in the perianal  area.  The most extensive complex being on the right perianal area.  He  had multiple internal rectal polyps just beyond the dentate line, which  were excised.   INDICATIONS FOR OPERATION:  The patient is a 40 year old with extensive  condylomata disease who comes in now for excision.   OPERATION:  The patient was taken to the operating room, placed on table  in the supine position.  After an adequate general endotracheal  anesthetic was administered, he was placed in the jackknife prone  position and prepped and draped in the usual sterile manner with the  gluteal cheeks spread apart and retracted apart with the tape.   We started on the right side where there was an extensive complex  measuring approximately 3.5 cm in size, which was excised with Allis  clamps.  Because of this distance away from the anorectal junction, we  closed this with interrupted 3-0 Prolene sutures.  There was another  extensive complex on the left side extending  posteriorly, which were  excised and closed with 3-0 Chromic.  There was an internal component,  which was closed with 3-0 Chromic and then we did more or less an  epidermal-dermal layer fulguration or excision using electrocautery of  extensive condylomata and anorectal polyps.  Most of these were full  thickness dermal excisions into the subcutaneous tissue.  Gas was  evacuated away from the assistant and the surgeon using aspiration and  suction device.  We removed as  much of the disease as we could, however, there were some smaller  polyps, which were cauterized and left in place.  We covered it with  antibiotic ointment.  We made sure that the anal opening was wide and  there was no break in integrity of the sphincter.  Dressing was applied  with 4x4 and antibiotic ointments.      Cherylynn Ridges, M.D.  Electronically Signed     JOW/MEDQ  D:  01/21/2008  T:  01/22/2008  Job:  [id number]

## 2010-11-05 ENCOUNTER — Encounter: Payer: Self-pay | Admitting: Infectious Disease

## 2010-11-05 ENCOUNTER — Ambulatory Visit (INDEPENDENT_AMBULATORY_CARE_PROVIDER_SITE_OTHER): Payer: Medicaid Other | Admitting: Infectious Disease

## 2010-11-05 DIAGNOSIS — Z113 Encounter for screening for infections with a predominantly sexual mode of transmission: Secondary | ICD-10-CM

## 2010-11-05 DIAGNOSIS — R809 Proteinuria, unspecified: Secondary | ICD-10-CM

## 2010-11-05 DIAGNOSIS — I10 Essential (primary) hypertension: Secondary | ICD-10-CM

## 2010-11-05 DIAGNOSIS — N529 Male erectile dysfunction, unspecified: Secondary | ICD-10-CM

## 2010-11-05 DIAGNOSIS — E291 Testicular hypofunction: Secondary | ICD-10-CM

## 2010-11-05 DIAGNOSIS — E781 Pure hyperglyceridemia: Secondary | ICD-10-CM

## 2010-11-05 DIAGNOSIS — B20 Human immunodeficiency virus [HIV] disease: Secondary | ICD-10-CM

## 2010-11-05 MED ORDER — AMLODIPINE BESYLATE 5 MG PO TABS: 5.0000 mg | ORAL_TABLET | Freq: Every day | ORAL | Status: DC

## 2010-11-05 MED ORDER — RITONAVIR 100 MG PO TABS: 100.0000 mg | ORAL_TABLET | Freq: Every day | ORAL | Status: DC

## 2010-11-05 MED ORDER — OMEGA-3-ACID ETHYL ESTERS 1 G PO CAPS: 2.0000 g | ORAL_CAPSULE | Freq: Two times a day (BID) | ORAL | Status: DC

## 2010-11-05 MED ORDER — SILDENAFIL CITRATE 50 MG PO TABS: 25.0000 mg | ORAL_TABLET | Freq: Every day | ORAL | Status: AC | PRN

## 2010-11-05 NOTE — Assessment & Plan Note (Signed)
Recheck microalbumin to creatinine ratio.

## 2010-11-05 NOTE — Assessment & Plan Note (Signed)
Check testosterone level.  

## 2010-11-05 NOTE — Patient Instructions (Signed)
I would like you to consider the idea of exchanging the protease inhibitor part of you regimen The reyataz, norvir For one of the following medications:  rilpivirine (edurant)  etravirine (intelence)  efavirenz (sustiva)  raltegravir (isentress)

## 2010-11-05 NOTE — Assessment & Plan Note (Signed)
See above discussion to optimize his triglycerides. For now we'll continue his current regimen with Norvir changed to tablet form.

## 2010-11-05 NOTE — Progress Notes (Signed)
  Subjective:    Patient ID: Carlos Hughes, male    DOB: 09/25/1970, 40 y.o.   MRN: [id number]  HPI  40 year old [place] American male with HIV it is been very well controlled on Epzicom and Norvir boosted Reyataz. He is generally well today day. He denies depression. He has been able to recover and grieve the loss of his sister who died more than a year ago. He is 61 active with his HIV positive partner who also has an undetectable viral load. He states they use condoms with all forms of sexual or course other than oral sex. He has had some problems in maintaining an erection and we discussed assessing his hypogonadism testosterone level. Also was willing to send prescribe him up some Viagra provided he remain you would practice safe sex. Reviewed the patient's labs including his elevated triglycerides. I propose potentially changing him to a different antiretroviral regimen such as Epzicom pared with rilpivirine or etravirine. For the time being we'll change him to return your tablets per his request. Otherwise he has no other specific complaints today. We spent 45 minutes with Mr. gathers including greater than 50% of time counseling the patient and coordinating care.  Review of Systems  as in history of present illness otherwise remainder of 12 point review of systems is negative.     Objective:   Physical Exam     is alert and oriented x4. HNT normocephalic atraumatic Sclerae anicteric oropharynx clear neck supple cardiac exam regular rate and rhythm without murmurs rubs or rubs lungs clear to sedation bilaterally without wheezes rhonchi or rales abdomen soft nondistended nontender without organomegaly. His extremities with trace edema. Neurological exam nonfocal normal strength and sensation skin no rashes petechiae or lesions.     Assessment & Plan:  HIV INFECTION See above discussion to optimize his triglycerides. For now we'll continue his current regimen with Norvir changed to tablet  form.  HYPERTRIGLYCERIDEMIA See above discussion with regards to changing and possibly to etravirine or rilpivirine, he does not want to try efavirenz  TESTOSTERONE DEFICIENCY Check testosterone level.  PROTEINURIA Recheck microalbumin to creatinine ratio.  ESSENTIAL HYPERTENSION, BENIGN He has not been taking his Norvasc. I am adding it back at a dose of 5 mg  Erectile dysfunction We'll recheck his testosterone level. However given Viagra which he was started [place] to 25 mg prior to sexual intercourse.

## 2010-11-05 NOTE — Assessment & Plan Note (Signed)
We'll recheck his testosterone level. However given Viagra which he was started [place] to 25 mg prior to sexual intercourse.

## 2010-11-05 NOTE — Assessment & Plan Note (Signed)
See above discussion with regards to changing and possibly to etravirine or rilpivirine, he does not want to try efavirenz

## 2010-11-05 NOTE — Assessment & Plan Note (Signed)
He has not been taking his Norvasc. I am adding it back at a dose of 5 mg

## 2010-11-06 ENCOUNTER — Other Ambulatory Visit: Payer: Self-pay | Admitting: Infectious Disease

## 2010-11-06 ENCOUNTER — Telehealth: Payer: Self-pay | Admitting: Infectious Disease

## 2010-11-06 DIAGNOSIS — E291 Testicular hypofunction: Secondary | ICD-10-CM

## 2010-11-06 MED ORDER — TESTOSTERONE 12.5 MG/ACT (1%) TD GEL: 4.0000 "application " | TRANSDERMAL | Status: DC

## 2010-11-06 NOTE — Telephone Encounter (Signed)
Carlos Hughes can you ask Farzad to up his testosterone to 4 applications per day (5grams). If he is already applying this much he will need to go up to 8 applications. Thanks

## 2010-11-14 ENCOUNTER — Telehealth: Payer: Self-pay | Admitting: *Deleted

## 2010-11-14 NOTE — Telephone Encounter (Deleted)
Attempted ph call to ins company for prior authorization.  Number busy x2.

## 2010-11-14 NOTE — Telephone Encounter (Signed)
Medicaid is requesting prior authorization for Lovaza rx.   Pt must try and fail another Medicaid preferred medication.  Options would be Tricor, Trilipix or gemfibrozil.

## 2010-11-14 NOTE — Telephone Encounter (Signed)
Patient called with concerns about a dark rash that appeared after starting the Norvir tablets from gel caps, its located on his arms,back,stomach and legs. Spoke with Traci Sermon NP he stated that it was unlikely that his rash was from switching from gel caps to tablets since it's the same drug. The patient denies any other new medications, and denies the rash itching at all. I offered the patient an appointment for a day this week, he wanted to come by today since the rash was still present. I suggested he let an RN look at it, but the provider may not be available to see him. Patient understood.

## 2010-11-22 ENCOUNTER — Emergency Department (HOSPITAL_COMMUNITY)
Admission: EM | Admit: 2010-11-22 | Discharge: 2010-11-22 | Discharge status: Against Medical Advice | Payer: Medicaid Other | Attending: Emergency Medicine | Admitting: Emergency Medicine

## 2010-11-22 DIAGNOSIS — R21 Rash and other nonspecific skin eruption: Secondary | ICD-10-CM | POA: Insufficient documentation

## 2010-11-27 ENCOUNTER — Other Ambulatory Visit: Payer: Self-pay | Admitting: *Deleted

## 2010-11-27 DIAGNOSIS — E781 Pure hyperglyceridemia: Secondary | ICD-10-CM

## 2010-11-27 MED ORDER — OMEGA-3-ACID ETHYL ESTERS 1 G PO CAPS: 2.0000 g | ORAL_CAPSULE | Freq: Two times a day (BID) | ORAL | Status: DC

## 2010-11-30 ENCOUNTER — Ambulatory Visit: Payer: Medicaid Other | Admitting: Internal Medicine

## 2010-12-03 ENCOUNTER — Ambulatory Visit (INDEPENDENT_AMBULATORY_CARE_PROVIDER_SITE_OTHER): Payer: Medicaid Other | Admitting: Infectious Disease

## 2010-12-03 DIAGNOSIS — A539 Syphilis, unspecified: Secondary | ICD-10-CM

## 2010-12-03 MED ORDER — PENICILLIN G BENZATHINE 1200000 UNIT/2ML IM SUSP
1.2000 10*6.[IU] | Freq: Once | INTRAMUSCULAR | Status: AC
  Administered 2010-12-03: 1.2 10*6.[IU] via INTRAMUSCULAR

## 2010-12-04 ENCOUNTER — Other Ambulatory Visit: Payer: Self-pay | Admitting: *Deleted

## 2010-12-05 NOTE — Telephone Encounter (Signed)
Encounter opened in error

## 2011-02-08 ENCOUNTER — Other Ambulatory Visit: Payer: Self-pay | Admitting: *Deleted

## 2011-02-08 DIAGNOSIS — B20 Human immunodeficiency virus [HIV] disease: Secondary | ICD-10-CM

## 2011-02-08 MED ORDER — MEGESTROL ACETATE 800 MG/20ML PO SUSP: 20.0000 mL | Freq: Every day | ORAL | Status: DC

## 2011-03-15 LAB — BASIC METABOLIC PANEL
CO2: 23
Calcium: 10.9 — ABNORMAL HIGH
Glucose, Bld: 80
Potassium: 4.6
Sodium: 137

## 2011-03-15 LAB — CBC
HCT: 48.5
Hemoglobin: 16.5
MCHC: 33.9
RDW: 16.3 — ABNORMAL HIGH

## 2011-03-15 LAB — DIFFERENTIAL
Basophils Absolute: 0
Basophils Relative: 0
Eosinophils Absolute: 0.1
Eosinophils Relative: 1
Monocytes Absolute: 1.1 — ABNORMAL HIGH

## 2011-04-11 ENCOUNTER — Other Ambulatory Visit: Payer: Self-pay | Admitting: *Deleted

## 2011-04-11 DIAGNOSIS — B20 Human immunodeficiency virus [HIV] disease: Secondary | ICD-10-CM

## 2011-04-11 DIAGNOSIS — E785 Hyperlipidemia, unspecified: Secondary | ICD-10-CM

## 2011-04-11 DIAGNOSIS — I10 Essential (primary) hypertension: Secondary | ICD-10-CM

## 2011-04-11 MED ORDER — ATAZANAVIR SULFATE 300 MG PO CAPS: 300.0000 mg | ORAL_CAPSULE | Freq: Every day | ORAL | Status: DC

## 2011-04-11 MED ORDER — ABACAVIR SULFATE-LAMIVUDINE 600-300 MG PO TABS: 1.0000 | ORAL_TABLET | Freq: Every day | ORAL | Status: DC

## 2011-04-11 MED ORDER — PRAVASTATIN SODIUM 20 MG PO TABS: 20.0000 mg | ORAL_TABLET | Freq: Every day | ORAL | Status: DC

## 2011-04-11 MED ORDER — METOPROLOL TARTRATE 100 MG PO TABS: 100.0000 mg | ORAL_TABLET | Freq: Two times a day (BID) | ORAL | Status: DC

## 2011-04-11 MED ORDER — LISINOPRIL 40 MG PO TABS: 40.0000 mg | ORAL_TABLET | Freq: Every day | ORAL | Status: DC

## 2011-04-15 ENCOUNTER — Other Ambulatory Visit: Payer: Self-pay | Admitting: *Deleted

## 2011-04-15 DIAGNOSIS — B20 Human immunodeficiency virus [HIV] disease: Secondary | ICD-10-CM

## 2011-04-15 MED ORDER — ATAZANAVIR SULFATE 300 MG PO CAPS: 300.0000 mg | ORAL_CAPSULE | Freq: Every day | ORAL | Status: DC

## 2011-05-15 ENCOUNTER — Other Ambulatory Visit: Payer: Self-pay | Admitting: Infectious Disease

## 2011-05-15 ENCOUNTER — Other Ambulatory Visit: Payer: Medicaid Other

## 2011-05-15 DIAGNOSIS — B20 Human immunodeficiency virus [HIV] disease: Secondary | ICD-10-CM

## 2011-05-15 DIAGNOSIS — E781 Pure hyperglyceridemia: Secondary | ICD-10-CM

## 2011-05-15 DIAGNOSIS — Z113 Encounter for screening for infections with a predominantly sexual mode of transmission: Secondary | ICD-10-CM

## 2011-05-15 LAB — CBC WITH DIFFERENTIAL/PLATELET
Basophils Relative: 0 % (ref 0–1)
Eosinophils Absolute: 0.1 10*3/uL (ref 0.0–0.7)
Hemoglobin: 14.1 g/dL (ref 13.0–17.0)
MCH: 33.6 pg (ref 26.0–34.0)
MCHC: 35.8 g/dL (ref 30.0–36.0)
Monocytes Relative: 10 % (ref 3–12)
Neutrophils Relative %: 65 % (ref 43–77)
Platelets: 195 10*3/uL (ref 150–400)

## 2011-05-15 LAB — LIPID PANEL
Total CHOL/HDL Ratio: 5.3 Ratio
VLDL: 68 mg/dL — ABNORMAL HIGH (ref 0–40)

## 2011-05-15 LAB — COMPLETE METABOLIC PANEL WITH GFR
Alkaline Phosphatase: 72 U/L (ref 39–117)
Creat: 1.8 mg/dL — ABNORMAL HIGH (ref 0.50–1.35)
GFR, Est Non African American: 46 mL/min — ABNORMAL LOW
Glucose, Bld: 96 mg/dL (ref 70–99)
Sodium: 136 mEq/L (ref 135–145)
Total Bilirubin: 5.8 mg/dL — ABNORMAL HIGH (ref 0.3–1.2)
Total Protein: 7.6 g/dL (ref 6.0–8.3)

## 2011-05-15 LAB — RPR TITER: RPR Titer: 1:8 {titer}

## 2011-05-15 LAB — RPR: RPR Ser Ql: REACTIVE — AB

## 2011-05-16 LAB — T.PALLIDUM AB, TOTAL: T pallidum Antibodies (TP-PA): >8 S/CO — ABNORMAL HIGH (ref <0.90)

## 2011-05-16 LAB — T-HELPER CELL (CD4) - (RCID CLINIC ONLY)
CD4 % Helper T Cell: 21 % — ABNORMAL LOW (ref 33–55)
CD4 T Cell Abs: 240 uL — ABNORMAL LOW (ref 400–2700)

## 2011-05-20 LAB — HIV-1 RNA QUANT-NO REFLEX-BLD: HIV 1 RNA Quant: 2620 copies/mL — ABNORMAL HIGH (ref <20)

## 2011-05-29 ENCOUNTER — Encounter: Payer: Self-pay | Admitting: Infectious Disease

## 2011-05-29 ENCOUNTER — Telehealth: Payer: Self-pay | Admitting: Infectious Disease

## 2011-05-29 ENCOUNTER — Ambulatory Visit
Admission: RE | Admit: 2011-05-29 | Discharge: 2011-05-29 | Disposition: A | Discharge status: Home / Self Care | Payer: Medicaid Other | Source: Ambulatory Visit | Attending: Infectious Disease | Admitting: Infectious Disease

## 2011-05-29 ENCOUNTER — Ambulatory Visit (INDEPENDENT_AMBULATORY_CARE_PROVIDER_SITE_OTHER): Payer: Medicaid Other | Admitting: Infectious Disease

## 2011-05-29 VITALS — BP 152/99 | HR 67 | Temp 97.4°F | Resp 14 | Ht 68.0 in | Wt 183.2 lb

## 2011-05-29 DIAGNOSIS — I10 Essential (primary) hypertension: Secondary | ICD-10-CM

## 2011-05-29 DIAGNOSIS — Z23 Encounter for immunization: Secondary | ICD-10-CM

## 2011-05-29 DIAGNOSIS — B20 Human immunodeficiency virus [HIV] disease: Secondary | ICD-10-CM

## 2011-05-29 DIAGNOSIS — R05 Cough: Secondary | ICD-10-CM

## 2011-05-29 DIAGNOSIS — R059 Cough, unspecified: Secondary | ICD-10-CM

## 2011-05-29 DIAGNOSIS — J189 Pneumonia, unspecified organism: Secondary | ICD-10-CM | POA: Insufficient documentation

## 2011-05-29 LAB — CBC WITH DIFFERENTIAL/PLATELET
Basophils Relative: 0 % (ref 0–1)
Eosinophils Absolute: 0.1 10*3/uL (ref 0.0–0.7)
Eosinophils Relative: 2 % (ref 0–5)
MCH: 33.7 pg (ref 26.0–34.0)
MCHC: 35.6 g/dL (ref 30.0–36.0)
Neutrophils Relative %: 53 % (ref 43–77)
Platelets: 207 10*3/uL (ref 150–400)
RBC: 4.36 MIL/uL (ref 4.22–5.81)
RDW: 15.5 % (ref 11.5–15.5)

## 2011-05-29 LAB — COMPLETE METABOLIC PANEL WITH GFR
ALT: 11 U/L (ref 0–53)
Alkaline Phosphatase: 60 U/L (ref 39–117)
Creat: 1.76 mg/dL — ABNORMAL HIGH (ref 0.50–1.35)
GFR, Est Non African American: 48 mL/min — ABNORMAL LOW
Sodium: 137 mEq/L (ref 135–145)
Total Bilirubin: 3.2 mg/dL — ABNORMAL HIGH (ref 0.3–1.2)
Total Protein: 8.1 g/dL (ref 6.0–8.3)

## 2011-05-29 MED ORDER — LEVOFLOXACIN 500 MG PO TABS: 500.0000 mg | ORAL_TABLET | Freq: Every day | ORAL | Status: AC

## 2011-05-29 NOTE — Progress Notes (Signed)
  Subjective:    Patient ID: Carlos Hughes, male    DOB: February 21, 1971, 40 y.o.   MRN: [id number]  HPI  40 year old African American male on epzicom , reyataz and norvir suddenly with viral load in the 2k range and cd4 down to barely above 200. He also has recent onset of sore throat, sinus congestion and cough productive of white phlegm. No fevers, chills, Positive pleuritic chest pain and with cough. occ throat soreness worse with coughing.  Review of Systems  Constitutional: Negative for fever, chills, diaphoresis, activity change, appetite change, fatigue and unexpected weight change.  HENT: Positive for congestion, sore throat and rhinorrhea. Negative for sneezing, trouble swallowing and sinus pressure.   Eyes: Negative for photophobia and visual disturbance.  Respiratory: Positive for cough and chest tightness. Negative for shortness of breath, wheezing and stridor.   Cardiovascular: Negative for chest pain, palpitations and leg swelling.  Gastrointestinal: Negative for nausea, vomiting, abdominal pain, diarrhea, constipation, blood in stool, abdominal distention and anal bleeding.  Genitourinary: Negative for dysuria, hematuria, flank pain and difficulty urinating.  Musculoskeletal: Negative for myalgias, back pain, joint swelling, arthralgias and gait problem.  Skin: Negative for color change, pallor, rash and wound.  Neurological: Negative for dizziness, tremors, weakness and light-headedness.  Hematological: Negative for adenopathy. Does not bruise/bleed easily.  Psychiatric/Behavioral: Negative for behavioral problems, confusion, sleep disturbance, dysphoric mood, decreased concentration and agitation.       Objective:   Physical Exam  Constitutional: He is oriented to person, place, and time. He appears well-developed and well-nourished. No distress.  HENT:  Head: Normocephalic and atraumatic.  Mouth/Throat: Oropharynx is clear and moist. No oropharyngeal exudate.  Eyes:  Conjunctivae and EOM are normal. Pupils are equal, round, and reactive to light. No scleral icterus.  Neck: Normal range of motion. Neck supple. No JVD present.  Cardiovascular: Normal rate, regular rhythm and normal heart sounds.  Exam reveals no gallop and no friction rub.   No murmur heard. Pulmonary/Chest: Effort normal and breath sounds normal. No respiratory distress. He has no wheezes. He has no rales. He exhibits no tenderness.  Abdominal: He exhibits no distension and no mass. There is no tenderness. There is no rebound and no guarding.  Musculoskeletal: He exhibits no edema and no tenderness.  Lymphadenopathy:    He has no cervical adenopathy.  Neurological: He is alert and oriented to person, place, and time. He has normal reflexes. He exhibits normal muscle tone. Coordination normal.  Skin: Skin is warm and dry. He is not diaphoretic. No erythema. No pallor.  Psychiatric: He has a normal mood and affect. His behavior is normal. Judgment and thought content normal.          Assessment & Plan:  PRODUCTIVE COUGH Check labs, ldh, cxr 2 view. Likely give him doxy if cxr clear or bronchitic changes  HIV INFECTION Denies missing more than a few doses, but clearly NOT the case. Repeat cd4 and viral load ordered and genotype.  ESSENTIAL HYPERTENSION, BENIGN Not optimally controlled but will tack more aggressively at nonacute visit

## 2011-05-29 NOTE — Patient Instructions (Signed)
We will get CXR today and labs I will call in an antibiotic to your pharmacy once I know results of your CXR Make appt in 4 weeks for blood work and return appt in 6 weeks

## 2011-05-29 NOTE — Telephone Encounter (Signed)
Dr. Daiva Eves, I spoke with [facility] and he has already picked up the Levaquin. Wendall Mola CMA

## 2011-05-29 NOTE — Telephone Encounter (Signed)
Thanks Jackie 

## 2011-05-29 NOTE — Assessment & Plan Note (Signed)
Denies missing more than a few doses, but clearly NOT the case. Repeat cd4 and viral load ordered and genotype.

## 2011-05-29 NOTE — Assessment & Plan Note (Signed)
Check labs, ldh, cxr 2 view. Likely give him doxy if cxr clear or bronchitic changes

## 2011-05-29 NOTE — Assessment & Plan Note (Signed)
Not optimally controlled but will tack more aggressively at nonacute visit

## 2011-05-29 NOTE — Telephone Encounter (Signed)
Pneumonia lobar, start levaquin  Annice Pih can you make sure he gets this later today, just esent in

## 2011-06-19 ENCOUNTER — Other Ambulatory Visit: Payer: Self-pay | Admitting: *Deleted

## 2011-06-19 DIAGNOSIS — E291 Testicular hypofunction: Secondary | ICD-10-CM

## 2011-06-19 MED ORDER — TESTOSTERONE 12.5 MG/ACT (1%) TD GEL: 4.0000 "application " | TRANSDERMAL | Status: DC

## 2011-06-24 ENCOUNTER — Other Ambulatory Visit: Payer: Self-pay | Admitting: Infectious Disease

## 2011-06-24 DIAGNOSIS — B20 Human immunodeficiency virus [HIV] disease: Secondary | ICD-10-CM

## 2011-07-10 ENCOUNTER — Ambulatory Visit (INDEPENDENT_AMBULATORY_CARE_PROVIDER_SITE_OTHER): Payer: Medicaid Other | Admitting: Infectious Disease

## 2011-07-10 ENCOUNTER — Encounter: Payer: Self-pay | Admitting: Infectious Disease

## 2011-07-10 DIAGNOSIS — B20 Human immunodeficiency virus [HIV] disease: Secondary | ICD-10-CM

## 2011-07-10 DIAGNOSIS — I10 Essential (primary) hypertension: Secondary | ICD-10-CM

## 2011-07-10 DIAGNOSIS — A529 Late syphilis, unspecified: Secondary | ICD-10-CM | POA: Insufficient documentation

## 2011-07-10 LAB — COMPLETE METABOLIC PANEL WITH GFR
ALT: 9 U/L (ref 0–53)
Albumin: 4.1 g/dL (ref 3.5–5.2)
Alkaline Phosphatase: 58 U/L (ref 39–117)
CO2: 23 mEq/L (ref 19–32)
GFR, Est Non African American: 49 mL/min — ABNORMAL LOW
Glucose, Bld: 126 mg/dL — ABNORMAL HIGH (ref 70–99)
Potassium: 4.2 mEq/L (ref 3.5–5.3)
Sodium: 137 mEq/L (ref 135–145)
Total Protein: 7.3 g/dL (ref 6.0–8.3)

## 2011-07-10 LAB — CBC WITH DIFFERENTIAL/PLATELET
HCT: 39.4 % (ref 39.0–52.0)
Hemoglobin: 13.7 g/dL (ref 13.0–17.0)
Lymphocytes Relative: 49 % — ABNORMAL HIGH (ref 12–46)
Lymphs Abs: 3.3 10*3/uL (ref 0.7–4.0)
Neutrophils Relative %: 43 % (ref 43–77)
Platelets: 254 10*3/uL (ref 150–400)

## 2011-07-10 MED ORDER — METOPROLOL SUCCINATE ER 200 MG PO TB24: 200.0000 mg | ORAL_TABLET | Freq: Every day | ORAL | Status: DC

## 2011-07-10 MED ORDER — METOPROLOL SUCCINATE ER 50 MG PO TB24: 50.0000 mg | ORAL_TABLET | Freq: Every day | ORAL | Status: DC

## 2011-07-10 NOTE — Assessment & Plan Note (Signed)
Increase betab blocker to toprol xl 250mg  daily

## 2011-07-10 NOTE — Assessment & Plan Note (Signed)
Sp treatment. WIll recheck rpr at next visit

## 2011-07-10 NOTE — Progress Notes (Signed)
  Subjective:    Patient ID: Carlos Hughes, male    DOB: Jul 17, 1970, 41 y.o.   MRN: [id number]  HPI  41 year old African American male on epzicom , reyataz and norvir suddenly with viral load in the 2k to 5k range and cd4 down to barely above 200. Genotype was attempted but would not run possibly due to "interfering substance." He was treated for syphilis over the summer after positive RPR at urgent care. His BP is not optimally controlled and we will try to escalate the dose.    Review of Systems  Constitutional: Negative for fever, chills, diaphoresis, activity change, appetite change, fatigue and unexpected weight change.  HENT: Negative for congestion, sore throat, rhinorrhea, sneezing, trouble swallowing and sinus pressure.   Eyes: Negative for photophobia and visual disturbance.  Respiratory: Negative for cough, chest tightness, shortness of breath, wheezing and stridor.   Cardiovascular: Negative for chest pain, palpitations and leg swelling.  Gastrointestinal: Negative for nausea, vomiting, abdominal pain, diarrhea, constipation, blood in stool, abdominal distention and anal bleeding.  Genitourinary: Negative for dysuria, hematuria, flank pain and difficulty urinating.  Musculoskeletal: Negative for myalgias, back pain, joint swelling, arthralgias and gait problem.  Skin: Negative for color change, pallor, rash and wound.  Neurological: Negative for dizziness, tremors, weakness and light-headedness.  Hematological: Negative for adenopathy. Does not bruise/bleed easily.  Psychiatric/Behavioral: Negative for behavioral problems, confusion, sleep disturbance, dysphoric mood, decreased concentration and agitation.       Objective:   Physical Exam  Constitutional: He is oriented to person, place, and time. He appears well-developed and well-nourished. No distress.  HENT:  Head: Normocephalic and atraumatic.  Mouth/Throat: Oropharynx is clear and moist. No oropharyngeal exudate.    Eyes: Conjunctivae and EOM are normal. Pupils are equal, round, and reactive to light. No scleral icterus.  Neck: Normal range of motion. Neck supple. No JVD present.  Cardiovascular: Normal rate, regular rhythm and normal heart sounds.  Exam reveals no gallop and no friction rub.   No murmur heard. Pulmonary/Chest: Effort normal and breath sounds normal. No respiratory distress. He has no wheezes. He has no rales. He exhibits no tenderness.  Abdominal: He exhibits no distension and no mass. There is no tenderness. There is no rebound and no guarding.  Musculoskeletal: He exhibits no edema and no tenderness.  Lymphadenopathy:    He has no cervical adenopathy.  Neurological: He is alert and oriented to person, place, and time. He has normal reflexes. He exhibits normal muscle tone. Coordination normal.  Skin: Skin is warm and dry. He is not diaphoretic. No erythema. No pallor.  Psychiatric: He has a normal mood and affect. His behavior is normal. Judgment and thought content normal.          Assessment & Plan:  HIV INFECTION Recheck hiv vl , and cd4. Hopefully not developed R  Syphilis, late Sp treatment. WIll recheck rpr at next visit  ESSENTIAL HYPERTENSION, BENIGN Increase betab blocker to toprol xl 250mg  daily

## 2011-07-10 NOTE — Assessment & Plan Note (Signed)
Recheck hiv vl , and cd4. Hopefully not developed R

## 2011-07-11 LAB — T-HELPER CELL (CD4) - (RCID CLINIC ONLY): CD4 T Cell Abs: 830 uL (ref 400–2700)

## 2011-07-24 ENCOUNTER — Telehealth: Payer: Self-pay | Admitting: Licensed Clinical Social Worker

## 2011-07-24 LAB — HIV-1 GENOTYPR PLUS

## 2011-07-24 NOTE — Telephone Encounter (Signed)
Spoke with [facility] lab the viral load was too low to do genotype on this patient

## 2011-07-25 NOTE — Telephone Encounter (Signed)
Very good 

## 2011-08-12 ENCOUNTER — Encounter: Payer: Medicaid Other | Admitting: Infectious Disease

## 2011-08-12 ENCOUNTER — Telehealth: Payer: Self-pay | Admitting: *Deleted

## 2011-08-12 NOTE — Telephone Encounter (Signed)
Called patient about his appointment today. He advised he did not have a ride and needs to reschedule. Transferred him to the front to make new appt.

## 2011-08-21 ENCOUNTER — Ambulatory Visit (INDEPENDENT_AMBULATORY_CARE_PROVIDER_SITE_OTHER): Payer: Medicaid Other | Admitting: Infectious Disease

## 2011-08-21 ENCOUNTER — Encounter: Payer: Self-pay | Admitting: Infectious Disease

## 2011-08-21 VITALS — BP 170/105 | HR 65 | Temp 97.5°F | Wt 197.0 lb

## 2011-08-21 DIAGNOSIS — Z23 Encounter for immunization: Secondary | ICD-10-CM

## 2011-08-21 DIAGNOSIS — I10 Essential (primary) hypertension: Secondary | ICD-10-CM

## 2011-08-21 DIAGNOSIS — B20 Human immunodeficiency virus [HIV] disease: Secondary | ICD-10-CM

## 2011-08-21 DIAGNOSIS — N189 Chronic kidney disease, unspecified: Secondary | ICD-10-CM

## 2011-08-21 NOTE — Assessment & Plan Note (Signed)
Recheck viral load today continue Epzicom and boosted Reyataz

## 2011-08-21 NOTE — Progress Notes (Signed)
  Subjective:    Patient ID: Carlos Hughes, male    DOB: Apr 26, 1971, 41 y.o.   MRN: [id number]  HPI  41 year old with HIV, CKD, HTN, who had viral load pop into the low 2k range and cd4 down to 200s this fall, now appears to be undetectable and with healthy CD4 count. He has no complaints and is doing well. BP is higher in clinic today improved after relaxing. Jerilynn Mages will need another agent.  Review of Systems  Constitutional: Negative for fever, chills, diaphoresis, activity change, appetite change, fatigue and unexpected weight change.  HENT: Negative for congestion, sore throat, rhinorrhea, sneezing, trouble swallowing and sinus pressure.   Eyes: Negative for photophobia and visual disturbance.  Respiratory: Negative for cough, chest tightness, shortness of breath, wheezing and stridor.   Cardiovascular: Negative for chest pain, palpitations and leg swelling.  Gastrointestinal: Negative for nausea, vomiting, abdominal pain, diarrhea, constipation, blood in stool, abdominal distention and anal bleeding.  Genitourinary: Negative for dysuria, hematuria, flank pain and difficulty urinating.  Musculoskeletal: Negative for myalgias, back pain, joint swelling, arthralgias and gait problem.  Skin: Negative for color change, pallor, rash and wound.  Neurological: Negative for dizziness, tremors, weakness and light-headedness.  Hematological: Negative for adenopathy. Does not bruise/bleed easily.  Psychiatric/Behavioral: Negative for behavioral problems, confusion, sleep disturbance, dysphoric mood, decreased concentration and agitation.       Objective:   Physical Exam  Constitutional: He is oriented to person, place, and time. He appears well-developed and well-nourished. No distress.  HENT:  Head: Normocephalic and atraumatic.  Mouth/Throat: Oropharynx is clear and moist. No oropharyngeal exudate.  Eyes: Conjunctivae and EOM are normal. Pupils are equal, round, and reactive to light. No  scleral icterus.  Neck: Normal range of motion. Neck supple. No JVD present.  Cardiovascular: Normal rate, regular rhythm and normal heart sounds.  Exam reveals no gallop and no friction rub.   No murmur heard. Pulmonary/Chest: Effort normal and breath sounds normal. No respiratory distress. He has no wheezes. He has no rales. He exhibits no tenderness.  Abdominal: He exhibits no distension and no mass. There is no tenderness. There is no rebound and no guarding.  Musculoskeletal: He exhibits no edema and no tenderness.  Lymphadenopathy:    He has no cervical adenopathy.  Neurological: He is alert and oriented to person, place, and time. He has normal reflexes. He exhibits normal muscle tone. Coordination normal.  Skin: Skin is warm and dry. He is not diaphoretic. No erythema. No pallor.  Psychiatric: He has a normal mood and affect. His behavior is normal. Judgment and thought content normal.          Assessment & Plan:  HIV INFECTION Recheck viral load today continue Epzicom and boosted Reyataz  ESSENTIAL HYPERTENSION, BENIGN I have asked him to purchase a blood pressure cuff and monitor blood pressures at home he'll likely need to have addition of another agent possibly hydrochlorothiazide.  CHRONIC KIDNEY DISEASE UNSPECIFIED Stable to

## 2011-08-21 NOTE — Assessment & Plan Note (Signed)
Stable to

## 2011-08-21 NOTE — Assessment & Plan Note (Signed)
I have asked him to purchase a blood pressure cuff and monitor blood pressures at home he'll likely need to have addition of another agent possibly hydrochlorothiazide.

## 2011-08-23 LAB — HIV-1 RNA QUANT-NO REFLEX-BLD
HIV 1 RNA Quant: 57 copies/mL — ABNORMAL HIGH (ref <20)
HIV-1 RNA Quant, Log: 1.76 {Log} — ABNORMAL HIGH (ref <1.30)

## 2011-12-04 ENCOUNTER — Other Ambulatory Visit: Payer: Self-pay | Admitting: Infectious Disease

## 2011-12-23 ENCOUNTER — Other Ambulatory Visit: Payer: Self-pay | Admitting: *Deleted

## 2011-12-23 DIAGNOSIS — E291 Testicular hypofunction: Secondary | ICD-10-CM

## 2011-12-23 MED ORDER — TESTOSTERONE 12.5 MG/ACT (1%) TD GEL: 4.0000 "application " | TRANSDERMAL | Status: DC

## 2012-02-06 ENCOUNTER — Other Ambulatory Visit: Payer: Self-pay | Admitting: Infectious Disease

## 2012-02-06 DIAGNOSIS — Z113 Encounter for screening for infections with a predominantly sexual mode of transmission: Secondary | ICD-10-CM

## 2012-02-12 ENCOUNTER — Other Ambulatory Visit (INDEPENDENT_AMBULATORY_CARE_PROVIDER_SITE_OTHER): Payer: Medicaid Other

## 2012-02-12 DIAGNOSIS — B20 Human immunodeficiency virus [HIV] disease: Secondary | ICD-10-CM

## 2012-02-12 LAB — CBC WITH DIFFERENTIAL/PLATELET
Basophils Absolute: 0 10*3/uL (ref 0.0–0.1)
Basophils Relative: 0 % (ref 0–1)
MCHC: 35.7 g/dL (ref 30.0–36.0)
Monocytes Absolute: 0.5 10*3/uL (ref 0.1–1.0)
Neutro Abs: 4.2 10*3/uL (ref 1.7–7.7)
Neutrophils Relative %: 68 % (ref 43–77)
Platelets: 182 10*3/uL (ref 150–400)
RDW: 16.9 % — ABNORMAL HIGH (ref 11.5–15.5)

## 2012-02-12 LAB — COMPLETE METABOLIC PANEL WITH GFR
ALT: 9 U/L (ref 0–53)
AST: 16 U/L (ref 0–37)
Albumin: 4.1 g/dL (ref 3.5–5.2)
CO2: 26 mEq/L (ref 19–32)
Calcium: 10.1 mg/dL (ref 8.4–10.5)
Chloride: 107 mEq/L (ref 96–112)
Potassium: 3.9 mEq/L (ref 3.5–5.3)
Total Protein: 7.2 g/dL (ref 6.0–8.3)

## 2012-02-13 LAB — HIV-1 RNA QUANT-NO REFLEX-BLD
HIV 1 RNA Quant: <20 copies/mL (ref <20)
HIV-1 RNA Quant, Log: <1.3 {Log} (ref <1.30)

## 2012-02-13 LAB — T.PALLIDUM AB, TOTAL: T pallidum Antibodies (TP-PA): >8 S/CO — ABNORMAL HIGH (ref <0.90)

## 2012-02-26 ENCOUNTER — Ambulatory Visit (INDEPENDENT_AMBULATORY_CARE_PROVIDER_SITE_OTHER): Payer: Medicaid Other | Admitting: Infectious Disease

## 2012-02-26 ENCOUNTER — Encounter: Payer: Self-pay | Admitting: Infectious Disease

## 2012-02-26 VITALS — BP 165/100 | HR 72 | Temp 98.3°F | Wt 181.0 lb

## 2012-02-26 DIAGNOSIS — I129 Hypertensive chronic kidney disease with stage 1 through stage 4 chronic kidney disease, or unspecified chronic kidney disease: Secondary | ICD-10-CM

## 2012-02-26 DIAGNOSIS — B20 Human immunodeficiency virus [HIV] disease: Secondary | ICD-10-CM

## 2012-02-26 DIAGNOSIS — N189 Chronic kidney disease, unspecified: Secondary | ICD-10-CM

## 2012-02-26 DIAGNOSIS — A529 Late syphilis, unspecified: Secondary | ICD-10-CM

## 2012-02-26 DIAGNOSIS — Z23 Encounter for immunization: Secondary | ICD-10-CM

## 2012-02-26 NOTE — Assessment & Plan Note (Signed)
He needs to be followed by nephrologist. Will check a microalbumin to creatinine ratio today.

## 2012-02-26 NOTE — Progress Notes (Signed)
  Subjective:    Patient ID: Carlos Hughes, male    DOB: [birth year], 41 y.o.   MRN: [id number]  HPI  Carlos Hughes is a 41 y.o. male who is doing superbly well on his  antiviral regimen, of Reyataz Norvir Epzicom with undetectable viral load and health cd4 count. Reviewed all of his labs today and his vital signs. His blood pressure was elevated today which he believes is due to stress coming to clinic. I reviewed his microalbuminuria and my concerns about blood pressure leading to further renal compromise. I've recommended that he see a nephrologist as well as be plugged into a primary care physician for more optimal management of his blood pressure. Otherwise he is doing well.   Review of Systems  Constitutional: Negative for fever, chills, diaphoresis, activity change, appetite change, fatigue and unexpected weight change.  HENT: Negative for congestion, sore throat, rhinorrhea, sneezing, trouble swallowing and sinus pressure.   Eyes: Negative for photophobia and visual disturbance.  Respiratory: Negative for cough, chest tightness, shortness of breath, wheezing and stridor.   Cardiovascular: Negative for chest pain, palpitations and leg swelling.  Gastrointestinal: Negative for nausea, vomiting, abdominal pain, diarrhea, constipation, blood in stool, abdominal distention and anal bleeding.  Genitourinary: Negative for dysuria, hematuria, flank pain and difficulty urinating.  Musculoskeletal: Negative for myalgias, back pain, joint swelling, arthralgias and gait problem.  Skin: Negative for color change, pallor, rash and wound.  Neurological: Negative for dizziness, tremors, weakness and light-headedness.  Hematological: Negative for adenopathy. Does not bruise/bleed easily.  Psychiatric/Behavioral: Negative for behavioral problems, confusion, disturbed wake/sleep cycle, dysphoric mood, decreased concentration and agitation.       Objective:   Physical Exam  Constitutional: He is  oriented to person, place, and time. He appears well-developed and well-nourished. No distress.  HENT:  Head: Normocephalic and atraumatic.  Mouth/Throat: Oropharynx is clear and moist. No oropharyngeal exudate.  Eyes: Conjunctivae normal and EOM are normal. Pupils are equal, round, and reactive to light. No scleral icterus.  Neck: Normal range of motion. Neck supple. No JVD present.  Cardiovascular: Normal rate, regular rhythm and normal heart sounds.  Exam reveals no gallop and no friction rub.   No murmur heard. Pulmonary/Chest: Effort normal and breath sounds normal. No respiratory distress. He has no wheezes. He has no rales. He exhibits no tenderness.  Abdominal: He exhibits no distension and no mass. There is no tenderness. There is no rebound and no guarding.  Musculoskeletal: He exhibits no edema and no tenderness.  Lymphadenopathy:    He has no cervical adenopathy.  Neurological: He is alert and oriented to person, place, and time. He has normal reflexes. He exhibits normal muscle tone. Coordination normal.  Skin: Skin is warm and dry. He is not diaphoretic. No erythema. No pallor.  Psychiatric: He has a normal mood and affect. His behavior is normal. Judgment and thought content normal.          Assessment & Plan:  HIV INFECTION Perfect control continue current regimen  Syphilis, late Status post treatment follow titers  CHRONIC KIDNEY DISEASE UNSPECIFIED He needs to be followed by nephrologist. Will check a microalbumin to creatinine ratio today.  Hypertensive nephropathy Likely needs another blood pressure medicine added. He will keep a log of blood pressure at home for [place] as well. We will refer him to a primary care physician.

## 2012-02-26 NOTE — Assessment & Plan Note (Signed)
Likely needs another blood pressure medicine added. He will keep a log of blood pressure at home for [place] as well. We will refer him to a primary care physician.

## 2012-02-26 NOTE — Assessment & Plan Note (Signed)
Status post treatment follow titers

## 2012-02-26 NOTE — Assessment & Plan Note (Signed)
Perfect control continue current regimen. 

## 2012-02-27 LAB — MICROALBUMIN / CREATININE URINE RATIO
Creatinine, Urine: 215.7 mg/dL
Microalb Creat Ratio: 393.1 mg/g — ABNORMAL HIGH (ref 0.0–30.0)
Microalb, Ur: 84.79 mg/dL — ABNORMAL HIGH (ref 0.00–1.89)

## 2012-04-24 ENCOUNTER — Other Ambulatory Visit: Payer: Self-pay | Admitting: Infectious Disease

## 2012-05-06 ENCOUNTER — Other Ambulatory Visit: Payer: Self-pay | Admitting: Infectious Disease

## 2012-05-16 ENCOUNTER — Other Ambulatory Visit: Payer: Self-pay | Admitting: Infectious Disease

## 2012-05-18 ENCOUNTER — Other Ambulatory Visit: Payer: Self-pay | Admitting: Infectious Disease

## 2012-06-13 ENCOUNTER — Other Ambulatory Visit: Payer: Self-pay | Admitting: Infectious Disease

## 2012-06-18 ENCOUNTER — Other Ambulatory Visit: Payer: Self-pay | Admitting: Infectious Disease

## 2012-06-29 ENCOUNTER — Other Ambulatory Visit: Payer: Medicaid Other

## 2012-06-29 DIAGNOSIS — B20 Human immunodeficiency virus [HIV] disease: Secondary | ICD-10-CM

## 2012-06-29 LAB — COMPLETE METABOLIC PANEL WITH GFR
ALT: 11 U/L (ref 0–53)
AST: 18 U/L (ref 0–37)
Albumin: 4 g/dL (ref 3.5–5.2)
Alkaline Phosphatase: 63 U/L (ref 39–117)
GFR, Est Non African American: 46 mL/min — ABNORMAL LOW
Glucose, Bld: 90 mg/dL (ref 70–99)
Potassium: 4.1 mEq/L (ref 3.5–5.3)
Sodium: 139 mEq/L (ref 135–145)
Total Bilirubin: 2.8 mg/dL — ABNORMAL HIGH (ref 0.3–1.2)
Total Protein: 6.9 g/dL (ref 6.0–8.3)

## 2012-06-29 LAB — CBC WITH DIFFERENTIAL/PLATELET
Eosinophils Absolute: 0.1 10*3/uL (ref 0.0–0.7)
Eosinophils Relative: 1 % (ref 0–5)
HCT: 42.2 % (ref 39.0–52.0)
Hemoglobin: 14.7 g/dL (ref 13.0–17.0)
Lymphocytes Relative: 37 % (ref 12–46)
Lymphs Abs: 2.3 10*3/uL (ref 0.7–4.0)
MCH: 32.8 pg (ref 26.0–34.0)
MCV: 94.2 fL (ref 78.0–100.0)
Monocytes Relative: 7 % (ref 3–12)
Platelets: 177 10*3/uL (ref 150–400)
RBC: 4.48 MIL/uL (ref 4.22–5.81)
WBC: 6.2 10*3/uL (ref 4.0–10.5)

## 2012-06-29 LAB — RPR TITER: RPR Titer: 1:2 {titer}

## 2012-06-29 LAB — LIPID PANEL
HDL: 29 mg/dL — ABNORMAL LOW (ref >39)
LDL Cholesterol: 47 mg/dL (ref 0–99)
Total CHOL/HDL Ratio: 4.5 Ratio
VLDL: 55 mg/dL — ABNORMAL HIGH (ref 0–40)

## 2012-06-30 LAB — T.PALLIDUM AB, TOTAL: T pallidum Antibodies (TP-PA): >8 S/CO — ABNORMAL HIGH (ref <0.90)

## 2012-07-09 ENCOUNTER — Other Ambulatory Visit: Payer: Self-pay | Admitting: Nephrology

## 2012-07-09 DIAGNOSIS — R809 Proteinuria, unspecified: Secondary | ICD-10-CM

## 2012-07-13 ENCOUNTER — Ambulatory Visit (INDEPENDENT_AMBULATORY_CARE_PROVIDER_SITE_OTHER): Payer: Medicaid Other | Admitting: Infectious Disease

## 2012-07-13 ENCOUNTER — Encounter: Payer: Self-pay | Admitting: Infectious Disease

## 2012-07-13 VITALS — BP 176/119 | HR 71 | Temp 98.0°F | Ht 68.0 in | Wt 185.0 lb

## 2012-07-13 DIAGNOSIS — I129 Hypertensive chronic kidney disease with stage 1 through stage 4 chronic kidney disease, or unspecified chronic kidney disease: Secondary | ICD-10-CM

## 2012-07-13 DIAGNOSIS — N189 Chronic kidney disease, unspecified: Secondary | ICD-10-CM

## 2012-07-13 DIAGNOSIS — B20 Human immunodeficiency virus [HIV] disease: Secondary | ICD-10-CM

## 2012-07-13 MED ORDER — CLONIDINE HCL 0.2 MG/24HR TD PTWK: 1.0000 | MEDICATED_PATCH | TRANSDERMAL | Status: DC

## 2012-07-13 MED ORDER — METOPROLOL SUCCINATE ER 100 MG PO TB24: 200.0000 mg | ORAL_TABLET | Freq: Every day | ORAL | Status: DC

## 2012-07-13 NOTE — Progress Notes (Signed)
  Subjective:    Patient ID: Carlos Hughes, male    DOB: September 07, 1970, 42 y.o.   MRN: [id number]  HPI  Carlos Hughes is a 42 y.o. male with HIV infection who is doing superbly well on HIS  antiviral regimen, epzicom, reyataz, norvir with undetectable viral load and health cd4 count. He has been to see Dr. Caryn Section with Nephrology who has scheduled Renal US. BP is still NOT optimally controlled and I will add in clonidine patch. I spent greater than 45 minutes with the patient including greater than 50% of time in face to face counsel of the patient and in coordination of their care.    Review of Systems  Constitutional: Negative for fever, chills, diaphoresis, activity change, appetite change, fatigue and unexpected weight change.  HENT: Negative for congestion, sore throat, rhinorrhea, sneezing, trouble swallowing and sinus pressure.   Eyes: Negative for photophobia and visual disturbance.  Respiratory: Negative for cough, chest tightness, shortness of breath, wheezing and stridor.   Cardiovascular: Negative for chest pain, palpitations and leg swelling.  Gastrointestinal: Negative for nausea, vomiting, abdominal pain, diarrhea, constipation, blood in stool, abdominal distention and anal bleeding.  Genitourinary: Negative for dysuria, hematuria, flank pain and difficulty urinating.  Musculoskeletal: Negative for myalgias, back pain, joint swelling, arthralgias and gait problem.  Skin: Negative for color change, pallor, rash and wound.  Neurological: Negative for dizziness, tremors, weakness and light-headedness.  Hematological: Negative for adenopathy. Does not bruise/bleed easily.  Psychiatric/Behavioral: Negative for behavioral problems, confusion, sleep disturbance, dysphoric mood, decreased concentration and agitation.       Objective:   Physical Exam  Constitutional: He is oriented to person, place, and time. He appears well-developed and well-nourished. No distress.  HENT:  Head:  Normocephalic and atraumatic.  Mouth/Throat: Oropharynx is clear and moist. No oropharyngeal exudate.  Eyes: Conjunctivae normal and EOM are normal. Pupils are equal, round, and reactive to light. No scleral icterus.  Neck: Normal range of motion. Neck supple. No JVD present.  Cardiovascular: Normal rate, regular rhythm and normal heart sounds.  Exam reveals no gallop and no friction rub.   No murmur heard. Pulmonary/Chest: Effort normal and breath sounds normal. No respiratory distress. He has no wheezes. He has no rales. He exhibits no tenderness.  Abdominal: He exhibits no distension and no mass. There is no tenderness. There is no rebound and no guarding.  Musculoskeletal: He exhibits no edema and no tenderness.  Lymphadenopathy:    He has no cervical adenopathy.  Neurological: He is alert and oriented to person, place, and time. He exhibits normal muscle tone. Coordination normal.  Skin: Skin is warm and dry. He is not diaphoretic. No erythema. No pallor.  Psychiatric: He has a normal mood and affect. His behavior is normal. Judgment and thought content normal.          Assessment & Plan:  HIV: well controlled. He will consider change to Tivicay and epzicom--> single tablet regimen in future, recheck labs in 3 months which he can check with me via mychart formal appt in 6 months  HTN: add in topical clonidine. He needs PCP but has been resistant to this. He will also seek advice from Nephrology  CKD: likely due to HTN poorly controlled renal US is pending. Greatly appreciate Dr. Scherrie Gerlach help.

## 2012-07-13 NOTE — Patient Instructions (Addendum)
Pt to come back for labs and bp  Check in 3 months  Then  Pt to come back for labs in 6 months with FULL visit

## 2012-07-17 ENCOUNTER — Other Ambulatory Visit: Payer: Medicaid Other

## 2012-07-17 ENCOUNTER — Other Ambulatory Visit: Payer: Self-pay | Admitting: Infectious Disease

## 2012-07-17 ENCOUNTER — Other Ambulatory Visit: Payer: Self-pay | Admitting: *Deleted

## 2012-07-17 DIAGNOSIS — E291 Testicular hypofunction: Secondary | ICD-10-CM

## 2012-07-17 MED ORDER — TESTOSTERONE 12.5 MG/ACT (1%) TD GEL: 4.0000 "application " | TRANSDERMAL | Status: DC

## 2012-07-21 ENCOUNTER — Ambulatory Visit
Admission: RE | Admit: 2012-07-21 | Discharge: 2012-07-21 | Disposition: A | Discharge status: Home / Self Care | Payer: Medicaid Other | Source: Ambulatory Visit | Attending: Nephrology | Admitting: Nephrology

## 2012-07-21 DIAGNOSIS — R809 Proteinuria, unspecified: Secondary | ICD-10-CM

## 2012-08-28 ENCOUNTER — Other Ambulatory Visit: Payer: Self-pay | Admitting: *Deleted

## 2012-08-28 ENCOUNTER — Telehealth: Payer: Self-pay | Admitting: *Deleted

## 2012-08-28 NOTE — Telephone Encounter (Signed)
Patient called to advised that his pharmacy has been trying to get approval for a refill of his Androgel. He advised there has been a change in the dosage (per manufacture). I called the pharmacy and they advised that they are no longer making the Androgel 1% it is now Androgel 1.62%. This translates from 12.5 mg of testosterone per pump to 20.25 testosterone per pump. Advised them will have to call back after I inform the Carlos Hughes and get the correct dosage for the patient.

## 2012-08-29 ENCOUNTER — Other Ambulatory Visit: Payer: Self-pay | Admitting: Infectious Disease

## 2012-08-31 ENCOUNTER — Other Ambulatory Visit: Payer: Self-pay | Admitting: *Deleted

## 2012-08-31 DIAGNOSIS — R7989 Other specified abnormal findings of blood chemistry: Secondary | ICD-10-CM

## 2012-08-31 MED ORDER — TESTOSTERONE 20.25 MG/ACT (1.62%) TD GEL: 20.2500 mg | Freq: Every morning | TRANSDERMAL | Status: DC

## 2012-09-11 ENCOUNTER — Other Ambulatory Visit: Payer: Self-pay | Admitting: *Deleted

## 2012-09-11 DIAGNOSIS — B20 Human immunodeficiency virus [HIV] disease: Secondary | ICD-10-CM

## 2012-09-11 MED ORDER — RITONAVIR 100 MG PO TABS: 100.0000 mg | ORAL_TABLET | Freq: Every day | ORAL | Status: DC

## 2012-09-15 ENCOUNTER — Telehealth: Payer: Self-pay | Admitting: *Deleted

## 2012-09-15 ENCOUNTER — Other Ambulatory Visit: Payer: Self-pay | Admitting: Infectious Disease

## 2012-09-15 NOTE — Telephone Encounter (Signed)
Per your last OFV, patient was taking 200mg  metoprolol XL QD and using a clonidine patch 0.2mg  QWeek.  Both prescriptions were sent for a year.  There were no refills given on the metoprolol 50mg  at this visit.  Pharmacy requested a refill on the 50mg  today.  Should he be taking all 3 prescriptions? Andree Coss, RN

## 2012-09-15 NOTE — Telephone Encounter (Signed)
What is he taking right now and where are his BP running? If he needs 250mg  of toprol then definitely should fill this but I am confused as to what he is taking exactly right now and where his bp is running

## 2012-10-12 ENCOUNTER — Other Ambulatory Visit: Payer: Medicaid Other

## 2012-10-22 ENCOUNTER — Other Ambulatory Visit: Payer: Self-pay | Admitting: Nephrology

## 2012-11-14 ENCOUNTER — Emergency Department (HOSPITAL_COMMUNITY): Payer: Medicaid Other

## 2012-11-14 ENCOUNTER — Emergency Department (HOSPITAL_COMMUNITY)
Admission: EM | Admit: 2012-11-14 | Discharge: 2012-11-14 | Disposition: A | Discharge status: Home / Self Care | Payer: Medicaid Other | Attending: Emergency Medicine | Admitting: Emergency Medicine

## 2012-11-14 ENCOUNTER — Encounter (HOSPITAL_COMMUNITY): Payer: Self-pay | Admitting: Emergency Medicine

## 2012-11-14 DIAGNOSIS — Z21 Asymptomatic human immunodeficiency virus [HIV] infection status: Secondary | ICD-10-CM | POA: Insufficient documentation

## 2012-11-14 DIAGNOSIS — N189 Chronic kidney disease, unspecified: Secondary | ICD-10-CM | POA: Insufficient documentation

## 2012-11-14 DIAGNOSIS — Z79899 Other long term (current) drug therapy: Secondary | ICD-10-CM | POA: Insufficient documentation

## 2012-11-14 DIAGNOSIS — R269 Unspecified abnormalities of gait and mobility: Secondary | ICD-10-CM | POA: Insufficient documentation

## 2012-11-14 DIAGNOSIS — B20 Human immunodeficiency virus [HIV] disease: Secondary | ICD-10-CM | POA: Insufficient documentation

## 2012-11-14 DIAGNOSIS — M109 Gout, unspecified: Secondary | ICD-10-CM

## 2012-11-14 DIAGNOSIS — I129 Hypertensive chronic kidney disease with stage 1 through stage 4 chronic kidney disease, or unspecified chronic kidney disease: Secondary | ICD-10-CM | POA: Insufficient documentation

## 2012-11-14 DIAGNOSIS — R000 Tachycardia, unspecified: Secondary | ICD-10-CM | POA: Insufficient documentation

## 2012-11-14 HISTORY — DX: Essential (primary) hypertension: I10

## 2012-11-14 HISTORY — DX: Human immunodeficiency virus (HIV) disease: B20

## 2012-11-14 LAB — CBC
MCH: 33.9 pg (ref 26.0–34.0)
MCV: 95.1 fL (ref 78.0–100.0)
Platelets: 195 10*3/uL (ref 150–400)
RBC: 4.25 MIL/uL (ref 4.22–5.81)
RDW: 14 % (ref 11.5–15.5)
WBC: 6.9 10*3/uL (ref 4.0–10.5)

## 2012-11-14 LAB — POCT I-STAT, CHEM 8
Calcium, Ion: 1.47 mmol/L — ABNORMAL HIGH (ref 1.12–1.23)
Glucose, Bld: 91 mg/dL (ref 70–99)
HCT: 43 % (ref 39.0–52.0)
Hemoglobin: 14.6 g/dL (ref 13.0–17.0)
Potassium: 4.1 mEq/L (ref 3.5–5.1)

## 2012-11-14 LAB — URIC ACID: Uric Acid, Serum: 9.3 mg/dL — ABNORMAL HIGH (ref 4.0–7.8)

## 2012-11-14 MED ORDER — HYDROCODONE-ACETAMINOPHEN 5-325 MG PO TABS: 1.0000 | ORAL_TABLET | ORAL | Status: DC | PRN

## 2012-11-14 MED ORDER — COLCHICINE 0.6 MG PO TABS
0.6000 mg | ORAL_TABLET | Freq: Once | ORAL | Status: AC
  Administered 2012-11-14: 0.6 mg via ORAL
  Filled 2012-11-14: qty 1

## 2012-11-14 MED ORDER — PREDNISONE 20 MG PO TABS
60.0000 mg | ORAL_TABLET | Freq: Once | ORAL | Status: AC
  Administered 2012-11-14: 60 mg via ORAL
  Filled 2012-11-14: qty 3

## 2012-11-14 MED ORDER — PREDNISONE 20 MG PO TABS: 40.0000 mg | ORAL_TABLET | Freq: Every day | ORAL | Status: DC

## 2012-11-14 MED ORDER — COLCHICINE 0.6 MG PO TABS: ORAL_TABLET | ORAL | Status: DC

## 2012-11-14 NOTE — ED Provider Notes (Signed)
Medical screening examination/treatment/procedure(s) were performed by non-physician practitioner and as supervising physician I was immediately available for consultation/collaboration. Devoria Albe, MD, Armando Gang   Ward Givens, MD 11/14/12 [phone]

## 2012-11-14 NOTE — ED Notes (Signed)
Pt dc to home. Pt sts understanding to dc instructions. Pt ambulatory to exit without difficulty. 

## 2012-11-14 NOTE — ED Notes (Signed)
Pt. Stated, I woke up yesterday morning and my rt. Toe joint was hurting when I bend it.  No injury to it.

## 2012-11-14 NOTE — ED Notes (Signed)
Pt c/o right great toe pain x 2 days; pt denies obvious injury

## 2012-11-14 NOTE — ED Provider Notes (Signed)
History    This chart was scribed for a non-physician practitioner, Dierdre Forth, PA-C, working with Ward Givens, MD by Frederik Pear, ED Scribe. This patient was seen in room TR07C/TR07C and the patient's care was started at 1659.   CSN: [id number]  Arrival date & time 11/14/12  1554   First MD Initiated Contact with Patient 11/14/12 1659      Chief Complaint  Patient presents with  . Toe Pain    (Consider location/radiation/quality/duration/timing/severity/associated sxs/prior treatment) Patient is a 42 y.o. male presenting with toe pain. The history is provided by the patient and medical records. No language interpreter was used.  Toe Pain This is a new problem. The current episode started 2 days ago. The problem occurs constantly. The problem has not changed since onset.Pertinent negatives include no chest pain, no abdominal pain, no headaches and no shortness of breath. The symptoms are aggravated by walking. Relieved by: not bearing weight on the foot.    HPI Comments: Carlos Hughes is a 42 y.o. male with a h/o of HIV, chronic kidney disease, hypertension who presents to the Emergency Department complaining of constant, severe right great toe pain with unchanging swelling that is aggravated by ambulating and bending the toe and alleviated by not bearing weight on his foot that began 2 days ago when he awoke without trauma or injury. He treated the pain at home with ice yesterday and elevating the foot several times. He states that he thought that pain was gradually improving this morning after elevating the area, but states that the pain worsened after he attempted to ambulate. He states that since the pain began that he has developed an antalgic gait. He denies fever, chills, nausea, emesis, ankle or knee pain. He denies any known recent insect or tick bites. He has no h/o of gastric ulcers or gout. He states that his most recent CD4 counts were normal.   Past Medical History   Diagnosis Date  . Hypertension   . HIV disease     History reviewed. No pertinent past surgical history.  History reviewed. No pertinent family history.  History  Substance Use Topics  . Smoking status: Never Smoker   . Smokeless tobacco: Not on file  . Alcohol Use: No      Review of Systems  Constitutional: Negative for fever, diaphoresis, appetite change, fatigue and unexpected weight change.  HENT: Negative for mouth sores and neck stiffness.   Eyes: Negative for visual disturbance.  Respiratory: Negative for cough, chest tightness, shortness of breath and wheezing.   Cardiovascular: Negative for chest pain.  Gastrointestinal: Negative for nausea, vomiting, abdominal pain, diarrhea and constipation.  Endocrine: Negative for polydipsia, polyphagia and polyuria.  Genitourinary: Negative for dysuria, urgency, frequency and hematuria.  Musculoskeletal: Positive for joint swelling, arthralgias and gait problem. Negative for back pain.  Skin: Negative for rash.  Allergic/Immunologic: Negative for immunocompromised state.  Neurological: Negative for syncope, light-headedness and headaches.  Hematological: Does not bruise/bleed easily.  Psychiatric/Behavioral: Negative for sleep disturbance. The patient is not nervous/anxious.     Allergies  Review of patient's allergies indicates no known allergies.  Home Medications   Current Outpatient Rx  Name  Route  Sig  Dispense  Refill  . abacavir-lamiVUDine (EPZICOM) 600-300 MG per tablet   Oral   Take 1 tablet by mouth daily.         . Ascorbic Acid (VITAMIN C) 1000 MG tablet   Oral   Take 1,000 mg by mouth daily.         Marland Kitchen  atazanavir (REYATAZ) 300 MG capsule   Oral   Take 300 mg by mouth daily with breakfast.         . cloNIDine (CATAPRES - DOSED IN MG/24 HR) 0.2 mg/24hr patch   Transdermal   Place 1 patch onto the skin once a week.         Marland Kitchen lisinopril (PRINIVIL,ZESTRIL) 40 MG tablet   Oral   Take 40 mg by  mouth daily.         . metoprolol succinate (TOPROL-XL) 100 MG 24 hr tablet   Oral   Take 200 mg by mouth daily. Take with or immediately following a meal.         . Multiple Vitamins-Minerals (MULTIVITAMIN PO)   Oral   Take 1 tablet by mouth daily.         . ritonavir (NORVIR) 100 MG capsule   Oral   Take 100 mg by mouth daily.         . Testosterone (ANDROGEL) 20.25 MG/1.25GM (1.62%) GEL   Transdermal   Place 20.25 mg onto the skin daily.         . colchicine 0.6 MG tablet      Take 0.3mg  (1/2 tablet) by mouth once per day for 1 weeks or until symptoms resolve   10 tablet   0   . HYDROcodone-acetaminophen (NORCO/VICODIN) 5-325 MG per tablet   Oral   Take 1 tablet by mouth every 4 (four) hours as needed for pain.   6 tablet   0   . predniSONE (DELTASONE) 20 MG tablet   Oral   Take 2 tablets (40 mg total) by mouth daily.   10 tablet   0     BP 152/95  Pulse 102  Temp(Src) 98.7 F (37.1 C) (Oral)  Resp 18  SpO2 99%  Physical Exam  Nursing note and vitals reviewed. Constitutional: He appears well-developed and well-nourished. No distress.  HENT:  Head: Normocephalic and atraumatic.  Mouth/Throat: Oropharynx is clear and moist.  Eyes: Conjunctivae are normal. Pupils are equal, round, and reactive to light.  Neck: Normal range of motion.  Cardiovascular: Normal rate, regular rhythm, normal heart sounds and intact distal pulses.   No murmur heard. Capillary refill < 3 sec  Pulmonary/Chest: Effort normal and breath sounds normal. No respiratory distress. He has no wheezes.  Musculoskeletal: He exhibits tenderness (to the right great toe). He exhibits no edema.  ROM: significant decrease in ROM of the great toe of the right foot with swelling, especially noted in the MTP and PIP of the great toe    Lymphadenopathy:    He has no cervical adenopathy.  Neurological: He is alert. Coordination normal.  Sensation intact to dull and sharp Strength  decreased in the right great toe 2/2 pain, 5/5 in the other toes and ankle.   Skin: Skin is warm and dry. He is not diaphoretic. There is erythema.  No tenting of the skin Mild erythema and warmth over the PIP and MTP of the right great toe without induration or fluctuance, no evidence of cellulitis, no wound  Psychiatric: He has a normal mood and affect.    ED Course  Procedures (including critical care time)  DIAGNOSTIC STUDIES: Oxygen Saturation is 99% on room air, normal by my interpretation.    COORDINATION OF CARE:  17:24- Discussed planned course of treatment with the patient, including a CBC, I-stat, and uric acid, who is agreeable at this time.  Results for orders placed during  the hospital encounter of 11/14/12  CBC      Result Value Range   WBC 6.9  4.0 - 10.5 K/uL   RBC 4.25  4.22 - 5.81 MIL/uL   Hemoglobin 14.4  13.0 - 17.0 g/dL   HCT 16.1  09.6 - 04.5 %   MCV 95.1  78.0 - 100.0 fL   MCH 33.9  26.0 - 34.0 pg   MCHC 35.6  30.0 - 36.0 g/dL   RDW 40.9  81.1 - 91.4 %   Platelets 195  150 - 400 K/uL  URIC ACID      Result Value Range   Uric Acid, Serum 9.3 (*) 4.0 - 7.8 mg/dL  POCT I-STAT, CHEM 8      Result Value Range   Sodium 141  135 - 145 mEq/L   Potassium 4.1  3.5 - 5.1 mEq/L   Chloride 111  96 - 112 mEq/L   BUN 15  6 - 23 mg/dL   Creatinine, Ser 7.82 (*) 0.50 - 1.35 mg/dL   Glucose, Bld 91  70 - 99 mg/dL   Calcium, Ion 9.56 (*) 1.12 - 1.23 mmol/L   TCO2 25  0 - 100 mmol/L   Hemoglobin 14.6  13.0 - 17.0 g/dL   HCT 21.3  08.6 - 57.8 %   Labs Reviewed  URIC ACID - Abnormal; Notable for the following:    Uric Acid, Serum 9.3 (*)    All other components within normal limits  POCT I-STAT, CHEM 8 - Abnormal; Notable for the following:    Creatinine, Ser 1.80 (*)    Calcium, Ion 1.47 (*)    All other components within normal limits  CBC   Dg Toe Great Right  11/14/2012   *RADIOLOGY REPORT*  Clinical Data: Right great toe pain/swelling, no known injury   RIGHT GREAT TOE  Comparison: None.  Findings: No fracture or dislocation is seen.  Moderate degenerative changes at the first MTP joint.  No marginal erosions are seen.  Visualized soft tissues are grossly unremarkable.  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   Original Report Authenticated By: Charline Bills, M.D.     1. Gout of big toe       MDM  Lavarius D Drummer dense with swelling of the right great toe without trauma. Patient with HIV therefore increased concern for infection however patient has been taking his medications and states CD4 count has been normal. She physical consistent with gout. Will obtain labs.  X-ray with moderate degenerative changes of the first MTP joint but no evidence of fracture or dislocation.  I personally reviewed the imaging tests through PACS system.  I reviewed available ER/hospitalization records through the EMR.    Pt presents with monoarticular pain, swelling and erythema.  Pt is afebrile and stable, mild tachycardia likely secondary to pain. Imaging reviewed, no evidence of occult fracture or injury.  Patient with history of chronic kidney disease and increased creatinine to 1.8.  Pt without known peptic ulcer disease and not receiving concurrent treatment on warfarin. Pt is however receiving treatment for his HIV including ritonavir.  Discussed patient's situation with pharmacy who recommends a 1 time dose of colchicine 0.6 mg ) 0.3 mg daily for one week only. In an effort to help decrease patient's inhalation will dose with prednisone for 5 days. We'll also give pain medication. Discussed that pt should respond to treatment with in 24 hour of begining treatment & likely resolve in 2-3 days. I  have also discussed reasons to return immediately to the ER.  Patient expresses understanding and agrees with plan.  I personally performed the services described in this documentation, which was scribed in my presence. The recorded  information has been reviewed and is accurate.   Dahlia Client Nanako Stopher, PA-C 11/14/12 1918

## 2012-11-18 ENCOUNTER — Telehealth: Payer: Self-pay | Admitting: Licensed Clinical Social Worker

## 2012-11-18 ENCOUNTER — Encounter: Payer: Self-pay | Admitting: Licensed Clinical Social Worker

## 2012-11-18 NOTE — Telephone Encounter (Signed)
This CSW received call from Carlos Hughes.  Carlos Hughes is not a patient at [facility], but was referred to this CSW for questions.  Pt states he attempted to receive care from Urgent Care but was declined due to [facility] not being listed on his insurance card as his PCP, [facility] was listed.  It is unfortunate with the [employer] system, [place] utilizes multiple NPI numbers per Medicaid and [facility] was inadvertently placed on Carlos Hughes card.  CSW mailed Carlos Hughes the change form with letter of instructions to take to [facility].

## 2012-11-20 ENCOUNTER — Encounter: Payer: Self-pay | Admitting: Nephrology

## 2012-12-17 ENCOUNTER — Other Ambulatory Visit: Payer: Self-pay | Admitting: Infectious Disease

## 2013-01-01 ENCOUNTER — Other Ambulatory Visit: Payer: Self-pay | Admitting: Infectious Disease

## 2013-01-04 ENCOUNTER — Other Ambulatory Visit: Payer: Self-pay | Admitting: Licensed Clinical Social Worker

## 2013-01-04 DIAGNOSIS — R7989 Other specified abnormal findings of blood chemistry: Secondary | ICD-10-CM

## 2013-01-04 MED ORDER — TESTOSTERONE 20.25 MG/1.25GM (1.62%) TD GEL: 20.2500 mg | Freq: Every day | TRANSDERMAL | Status: DC

## 2013-01-11 ENCOUNTER — Other Ambulatory Visit: Payer: Medicaid Other

## 2013-01-12 ENCOUNTER — Other Ambulatory Visit (INDEPENDENT_AMBULATORY_CARE_PROVIDER_SITE_OTHER): Payer: Medicaid Other

## 2013-01-12 ENCOUNTER — Other Ambulatory Visit (HOSPITAL_COMMUNITY)
Admission: RE | Admit: 2013-01-12 | Discharge: 2013-01-12 | Disposition: A | Discharge status: Home / Self Care | Payer: Medicaid Other | Source: Ambulatory Visit | Attending: Internal Medicine | Admitting: Internal Medicine

## 2013-01-12 DIAGNOSIS — B20 Human immunodeficiency virus [HIV] disease: Secondary | ICD-10-CM

## 2013-01-12 DIAGNOSIS — Z113 Encounter for screening for infections with a predominantly sexual mode of transmission: Secondary | ICD-10-CM | POA: Insufficient documentation

## 2013-01-12 DIAGNOSIS — I129 Hypertensive chronic kidney disease with stage 1 through stage 4 chronic kidney disease, or unspecified chronic kidney disease: Secondary | ICD-10-CM

## 2013-01-12 LAB — COMPLETE METABOLIC PANEL WITH GFR
Alkaline Phosphatase: 57 U/L (ref 39–117)
CO2: 26 mEq/L (ref 19–32)
Creat: 1.89 mg/dL — ABNORMAL HIGH (ref 0.50–1.35)
GFR, Est African American: 50 mL/min — ABNORMAL LOW
GFR, Est Non African American: 43 mL/min — ABNORMAL LOW
Glucose, Bld: 85 mg/dL (ref 70–99)
Total Bilirubin: 2.8 mg/dL — ABNORMAL HIGH (ref 0.3–1.2)

## 2013-01-12 LAB — CBC WITH DIFFERENTIAL/PLATELET
Basophils Absolute: 0 10*3/uL (ref 0.0–0.1)
Basophils Relative: 0 % (ref 0–1)
Eosinophils Absolute: 0.1 10*3/uL (ref 0.0–0.7)
Eosinophils Relative: 1 % (ref 0–5)
HCT: 37.2 % — ABNORMAL LOW (ref 39.0–52.0)
Lymphocytes Relative: 32 % (ref 12–46)
MCH: 33.4 pg (ref 26.0–34.0)
MCHC: 36.3 g/dL — ABNORMAL HIGH (ref 30.0–36.0)
MCV: 92.1 fL (ref 78.0–100.0)
Monocytes Absolute: 0.4 10*3/uL (ref 0.1–1.0)
RDW: 16.2 % — ABNORMAL HIGH (ref 11.5–15.5)

## 2013-01-12 LAB — RPR: RPR Ser Ql: REACTIVE — AB

## 2013-01-12 LAB — RPR TITER: RPR Titer: 1:1 {titer}

## 2013-01-12 LAB — LIPID PANEL
Cholesterol: 124 mg/dL (ref 0–200)
HDL: 22 mg/dL — ABNORMAL LOW (ref >39)
Triglycerides: 405 mg/dL — ABNORMAL HIGH (ref <150)

## 2013-01-22 ENCOUNTER — Other Ambulatory Visit: Payer: Self-pay | Admitting: Infectious Disease

## 2013-01-22 DIAGNOSIS — B20 Human immunodeficiency virus [HIV] disease: Secondary | ICD-10-CM

## 2013-02-24 ENCOUNTER — Encounter: Payer: Self-pay | Admitting: Infectious Disease

## 2013-02-24 ENCOUNTER — Ambulatory Visit (INDEPENDENT_AMBULATORY_CARE_PROVIDER_SITE_OTHER): Payer: Medicaid Other | Admitting: Infectious Disease

## 2013-02-24 VITALS — BP 165/101 | HR 60 | Temp 98.5°F | Wt 182.0 lb

## 2013-02-24 DIAGNOSIS — N183 Chronic kidney disease, stage 3 unspecified: Secondary | ICD-10-CM

## 2013-02-24 DIAGNOSIS — Z23 Encounter for immunization: Secondary | ICD-10-CM

## 2013-02-24 DIAGNOSIS — B20 Human immunodeficiency virus [HIV] disease: Secondary | ICD-10-CM

## 2013-02-24 DIAGNOSIS — I10 Essential (primary) hypertension: Secondary | ICD-10-CM

## 2013-02-24 NOTE — Progress Notes (Signed)
Subjective:    Patient ID: Carlos Hughes, male    DOB: 1971/04/17, 42 y.o.   MRN: [id number]  HPI   Carlos Hughes is a 42 y.o. male with HIV infection who is doing superbly well on HIS  antiviral regimen, epzicom, reyataz, norvir with undetectable viral load and health cd4 count.    He has been to see Dr. Caryn Section with Nephrology (though he has now retired).Renal US showed evidence of medical renal disease.   He started on amlodipine it appears but he had problems with edema in his legs that improved with discontinuing amlodipine. He still on metoprolol 100 mg twice daily and his pulse reflects this he is also on a clonidine patch. Blood pressure is high this morning although did not take his blood pressure medicine this morning. Serum creatinine is still creeping up and we discussed that his Epzicom may be need to be changed to its component parts abacavir and lamivudine  if his renal function worsens     Review of Systems  Constitutional: Negative for fever, chills, diaphoresis, activity change, appetite change, fatigue and unexpected weight change.  HENT: Negative for congestion, sore throat, rhinorrhea, sneezing, trouble swallowing and sinus pressure.   Eyes: Negative for photophobia and visual disturbance.  Respiratory: Negative for cough, chest tightness, shortness of breath, wheezing and stridor.   Cardiovascular: Negative for chest pain, palpitations and leg swelling.  Gastrointestinal: Negative for nausea, vomiting, abdominal pain, diarrhea, constipation, blood in stool, abdominal distention and anal bleeding.  Genitourinary: Negative for dysuria, hematuria, flank pain and difficulty urinating.  Musculoskeletal: Negative for myalgias, back pain, joint swelling, arthralgias and gait problem.  Skin: Negative for color change, pallor, rash and wound.  Neurological: Negative for dizziness, tremors, weakness and light-headedness.  Hematological: Negative for adenopathy. Does not  bruise/bleed easily.  Psychiatric/Behavioral: Negative for behavioral problems, confusion, sleep disturbance, dysphoric mood, decreased concentration and agitation.       Objective:   Physical Exam  Constitutional: He is oriented to person, place, and time. He appears well-developed and well-nourished. No distress.  HENT:  Head: Normocephalic and atraumatic.  Mouth/Throat: Oropharynx is clear and moist. No oropharyngeal exudate.  Eyes: Conjunctivae and EOM are normal. Pupils are equal, round, and reactive to light. No scleral icterus.  Neck: Normal range of motion. Neck supple. No JVD present.  Cardiovascular: Normal rate, regular rhythm and normal heart sounds.  Exam reveals no gallop and no friction rub.   No murmur heard. Pulmonary/Chest: Effort normal and breath sounds normal. No respiratory distress. He has no wheezes. He has no rales. He exhibits no tenderness.  Abdominal: He exhibits no distension and no mass. There is no tenderness. There is no rebound and no guarding.  Musculoskeletal: He exhibits no edema and no tenderness.  Lymphadenopathy:    He has no cervical adenopathy.  Neurological: He is alert and oriented to person, place, and time. He exhibits normal muscle tone. Coordination normal.  Skin: Skin is warm and dry. He is not diaphoretic. No erythema. No pallor.  Psychiatric: He has a normal mood and affect. His behavior is normal. Judgment and thought content normal.          Assessment & Plan:  HIV: continue current regimen. He had problems with compliance > year ago but now doing well, highly compliant. Will consider subbing in [place] for his Reyataz, Norvir IF we have to split up his epzicom AND he can remain highly compliant  HTN: Bring back in one week for BP  check. May need to increase the clonidine patch   He needs PCP   CKD: likely due to HTN poorly controlled renal US is pending. Greatly appreciate Nephrology's help

## 2013-03-09 ENCOUNTER — Telehealth: Payer: Self-pay | Admitting: *Deleted

## 2013-03-09 NOTE — Telephone Encounter (Signed)
Pt called [facility] asking in a message for dr vandam or his nurse to call him at [phone]

## 2013-03-09 NOTE — Telephone Encounter (Signed)
Tamika can you give Shunsuke the first call to see what is going on?

## 2013-03-10 ENCOUNTER — Telehealth: Payer: Self-pay | Admitting: Licensed Clinical Social Worker

## 2013-03-10 ENCOUNTER — Telehealth: Payer: Self-pay | Admitting: Infectious Disease

## 2013-03-10 MED ORDER — CLONIDINE HCL 0.3 MG/24HR TD PTWK: 1.0000 | MEDICATED_PATCH | TRANSDERMAL | Status: DC

## 2013-03-10 NOTE — Telephone Encounter (Signed)
[facility] was calling regarding getting an appointment with a primary care physician. I called [facility] to request an appointment with Dr. Nehemiah Settle

## 2013-03-10 NOTE — Telephone Encounter (Signed)
Increased by 0.3mg  clonidine

## 2013-03-10 NOTE — Telephone Encounter (Signed)
Patient called wanting to know if Dr. Daiva Eves could increase the Clonidine patch, it was discussed at the last office visit. He has kept a log of his BP and it has spike up again for this month, it was lower in August. Please advise.

## 2013-03-10 NOTE — Telephone Encounter (Signed)
I will call the patient

## 2013-03-17 ENCOUNTER — Other Ambulatory Visit: Payer: Self-pay | Admitting: Infectious Disease

## 2013-03-18 ENCOUNTER — Other Ambulatory Visit: Payer: Self-pay | Admitting: *Deleted

## 2013-03-18 ENCOUNTER — Telehealth: Payer: Self-pay | Admitting: *Deleted

## 2013-03-18 DIAGNOSIS — R7989 Other specified abnormal findings of blood chemistry: Secondary | ICD-10-CM

## 2013-03-18 MED ORDER — TESTOSTERONE 20.25 MG/1.25GM (1.62%) TD GEL: 20.2500 mg | Freq: Every day | TRANSDERMAL | Status: DC

## 2013-03-18 NOTE — Telephone Encounter (Signed)
Call from [facility] pharmacist Revonda Standard regarding directions on patient's Androgel. Responded to an electronic script with directions from [facility] and pharmacist stated he has been getting it for some time as 2 pumps. Note to Dr. Daiva Eves to please review directions and quantity to see if this is correct. Wendall Mola

## 2013-03-19 ENCOUNTER — Other Ambulatory Visit: Payer: Self-pay | Admitting: *Deleted

## 2013-03-19 ENCOUNTER — Other Ambulatory Visit: Payer: Self-pay | Admitting: Infectious Disease

## 2013-03-19 DIAGNOSIS — R7989 Other specified abnormal findings of blood chemistry: Secondary | ICD-10-CM

## 2013-03-19 MED ORDER — TESTOSTERONE 20.25 MG/1.25GM (1.62%) TD GEL: 20.2500 mg | Freq: Every day | TRANSDERMAL | Status: DC

## 2013-03-19 NOTE — Telephone Encounter (Signed)
Per Dr. Daiva Eves, [facility] called and okd 2 pumps, orders changed. Patient notified and scheduled for testosterone lab on 03/22/13. Wendall Mola

## 2013-03-22 ENCOUNTER — Other Ambulatory Visit: Payer: Medicaid Other

## 2013-04-17 ENCOUNTER — Other Ambulatory Visit: Payer: Self-pay | Admitting: Infectious Disease

## 2013-04-26 ENCOUNTER — Other Ambulatory Visit: Payer: Self-pay | Admitting: Infectious Disease

## 2013-05-25 ENCOUNTER — Telehealth: Payer: Self-pay | Admitting: *Deleted

## 2013-05-25 NOTE — Telephone Encounter (Addendum)
Stiffness/soreness in middle finger and ring on right hand (pain 1/10) x 2 weeks, pt concerned it may get worse, would like a referral to a hand specialist.  Pt given appointment with Dr. Daiva Eves for 12/10 at 10:15 to discuss this. Andree Coss, RN

## 2013-05-26 ENCOUNTER — Ambulatory Visit
Admission: RE | Admit: 2013-05-26 | Discharge: 2013-05-26 | Disposition: A | Discharge status: Home / Self Care | Payer: Medicaid Other | Source: Ambulatory Visit | Attending: Infectious Disease | Admitting: Infectious Disease

## 2013-05-26 ENCOUNTER — Ambulatory Visit (INDEPENDENT_AMBULATORY_CARE_PROVIDER_SITE_OTHER): Payer: Medicaid Other | Admitting: Infectious Disease

## 2013-05-26 ENCOUNTER — Encounter: Payer: Self-pay | Admitting: Infectious Disease

## 2013-05-26 ENCOUNTER — Other Ambulatory Visit (HOSPITAL_COMMUNITY)
Admission: RE | Admit: 2013-05-26 | Discharge: 2013-05-26 | Disposition: A | Discharge status: Home / Self Care | Payer: Medicaid Other | Source: Ambulatory Visit | Attending: Infectious Disease | Admitting: Infectious Disease

## 2013-05-26 DIAGNOSIS — I10 Essential (primary) hypertension: Secondary | ICD-10-CM

## 2013-05-26 DIAGNOSIS — Z113 Encounter for screening for infections with a predominantly sexual mode of transmission: Secondary | ICD-10-CM | POA: Insufficient documentation

## 2013-05-26 DIAGNOSIS — M79644 Pain in right finger(s): Secondary | ICD-10-CM

## 2013-05-26 DIAGNOSIS — M79609 Pain in unspecified limb: Secondary | ICD-10-CM

## 2013-05-26 DIAGNOSIS — B20 Human immunodeficiency virus [HIV] disease: Secondary | ICD-10-CM

## 2013-05-26 DIAGNOSIS — E785 Hyperlipidemia, unspecified: Secondary | ICD-10-CM

## 2013-05-26 LAB — CBC WITH DIFFERENTIAL/PLATELET
Basophils Absolute: 0 10*3/uL (ref 0.0–0.1)
Basophils Relative: 0 % (ref 0–1)
Eosinophils Absolute: 0.1 10*3/uL (ref 0.0–0.7)
Eosinophils Relative: 3 % (ref 0–5)
HCT: 39.2 % (ref 39.0–52.0)
Lymphs Abs: 1.1 10*3/uL (ref 0.7–4.0)
MCH: 30.4 pg (ref 26.0–34.0)
MCHC: 35.2 g/dL (ref 30.0–36.0)
MCV: 86.3 fL (ref 78.0–100.0)
Monocytes Absolute: 0.4 10*3/uL (ref 0.1–1.0)
Platelets: 256 10*3/uL (ref 150–400)
RBC: 4.54 MIL/uL (ref 4.22–5.81)
RDW: 17.2 % — ABNORMAL HIGH (ref 11.5–15.5)

## 2013-05-26 LAB — COMPLETE METABOLIC PANEL WITH GFR
ALT: 9 U/L (ref 0–53)
AST: 17 U/L (ref 0–37)
Albumin: 3.8 g/dL (ref 3.5–5.2)
BUN: 16 mg/dL (ref 6–23)
CO2: 24 mEq/L (ref 19–32)
Calcium: 10.3 mg/dL (ref 8.4–10.5)
Chloride: 104 mEq/L (ref 96–112)
GFR, Est African American: 52 mL/min — ABNORMAL LOW
Potassium: 4.5 mEq/L (ref 3.5–5.3)

## 2013-05-26 NOTE — Progress Notes (Signed)
Subjective:    Patient ID: Carlos Hughes, male    DOB: March 13, 1971, 42 y.o.   MRN: [id number]  Hand Pain  Pertinent negatives include no chest pain.    Carlos Hughes is a 42 y.o. male with HIV infection who is doing superbly well on HIS  antiviral regimen, epzicom, reyataz, norvir with undetectable viral load and health cd4 count.    He has been following with [facility].   He started on amlodipine it appears but he had problems with edema in his legs that improved  When he holds his amlodipine, they are aware of this and he takes this on certain days but not others.    He still on metoprolol 100 mg twice daily and his pulse reflects this he is also on a clonidine patch.   He comes in with complaint of bilateral pain in the third and 4th digits of each hand with stiffness each morning. No numbness. Goes away during the day.N o fevers, chills, malaise. Gout not relapsed at prior site.     Review of Systems  Constitutional: Negative for fever, chills, diaphoresis, activity change, appetite change, fatigue and unexpected weight change.  HENT: Negative for congestion, rhinorrhea, sinus pressure, sneezing, sore throat and trouble swallowing.   Eyes: Negative for photophobia and visual disturbance.  Respiratory: Negative for cough, chest tightness, shortness of breath, wheezing and stridor.   Cardiovascular: Negative for chest pain, palpitations and leg swelling.  Gastrointestinal: Negative for nausea, vomiting, abdominal pain, diarrhea, constipation, blood in stool, abdominal distention and anal bleeding.  Genitourinary: Negative for dysuria, hematuria, flank pain and difficulty urinating.  Musculoskeletal: Negative for arthralgias, back pain, gait problem, joint swelling and myalgias.  Skin: Negative for color change, pallor, rash and wound.  Neurological: Negative for dizziness, tremors, weakness and light-headedness.  Hematological: Negative for adenopathy. Does not  bruise/bleed easily.  Psychiatric/Behavioral: Negative for behavioral problems, confusion, sleep disturbance, dysphoric mood, decreased concentration and agitation.       Objective:   Physical Exam  Constitutional: He is oriented to person, place, and time. He appears well-developed and well-nourished. No distress.  HENT:  Head: Normocephalic and atraumatic.  Mouth/Throat: Oropharynx is clear and moist. No oropharyngeal exudate.  Eyes: Conjunctivae and EOM are normal. Pupils are equal, round, and reactive to light. No scleral icterus.  Neck: Normal range of motion. Neck supple. No JVD present.  Cardiovascular: Normal rate, regular rhythm and normal heart sounds.  Exam reveals no gallop and no friction rub.   No murmur heard. Pulmonary/Chest: Effort normal and breath sounds normal. No respiratory distress. He has no wheezes. He has no rales. He exhibits no tenderness.  Abdominal: He exhibits no distension and no mass. There is no tenderness. There is no rebound and no guarding.  Musculoskeletal: He exhibits no edema and no tenderness.  Lymphadenopathy:    He has no cervical adenopathy.  Neurological: He is alert and oriented to person, place, and time. He exhibits normal muscle tone. Coordination normal.  Skin: Skin is warm and dry. He is not diaphoretic. No erythema. No pallor.  Psychiatric: He has a normal mood and affect. His behavior is normal. Judgment and thought content normal.          Assessment & Plan:  HIV: continue current regimen. He had problems with compliance > year ago but now doing well, check labs. Could continue simplifying to TRIUMEQ if GFR holds   HTN: continue meds, fu with [facility] and needs pCP  CKD: likely  due to HTN poorly controlled renal US is pending. Greatly appreciate Nephrology's help  Hand pain: not clear what cause is. Get hand films. I spent greater than 45 minutes with the patient including greater than 50% of time in face to face counsel  of the patient and in coordination of their care.

## 2013-05-27 LAB — HIV-1 RNA QUANT-NO REFLEX-BLD
HIV 1 RNA Quant: <20 copies/mL (ref <20)
HIV-1 RNA Quant, Log: <1.3 {Log} (ref <1.30)

## 2013-05-27 LAB — T.PALLIDUM AB, TOTAL: T pallidum Antibodies (TP-PA): 2.98 S/CO — ABNORMAL HIGH (ref <0.90)

## 2013-05-27 LAB — RPR: RPR Ser Ql: REACTIVE — AB

## 2013-05-27 LAB — HEPATITIS C RNA QUANTITATIVE

## 2013-06-02 ENCOUNTER — Other Ambulatory Visit: Payer: Self-pay | Admitting: *Deleted

## 2013-06-02 ENCOUNTER — Other Ambulatory Visit: Payer: Self-pay | Admitting: Infectious Disease

## 2013-06-02 DIAGNOSIS — I10 Essential (primary) hypertension: Secondary | ICD-10-CM

## 2013-06-28 ENCOUNTER — Other Ambulatory Visit: Payer: Medicaid Other

## 2013-07-12 ENCOUNTER — Ambulatory Visit: Payer: Medicaid Other | Admitting: Infectious Disease

## 2013-07-12 ENCOUNTER — Other Ambulatory Visit: Payer: Self-pay | Admitting: Infectious Disease

## 2013-07-16 ENCOUNTER — Telehealth: Payer: Self-pay

## 2013-07-16 MED ORDER — CLONIDINE HCL 0.2 MG/24HR TD PTWK: MEDICATED_PATCH | TRANSDERMAL | Status: DC

## 2013-07-16 NOTE — Telephone Encounter (Signed)
Pharmacy requesting prior authorization on Clonidine 0.2mg  patch

## 2013-08-03 ENCOUNTER — Ambulatory Visit (INDEPENDENT_AMBULATORY_CARE_PROVIDER_SITE_OTHER): Payer: Medicaid Other | Admitting: Infectious Disease

## 2013-08-03 ENCOUNTER — Encounter: Payer: Self-pay | Admitting: Infectious Disease

## 2013-08-03 ENCOUNTER — Other Ambulatory Visit (HOSPITAL_COMMUNITY)
Admission: RE | Admit: 2013-08-03 | Discharge: 2013-08-03 | Disposition: A | Discharge status: Home / Self Care | Payer: Medicaid Other | Source: Ambulatory Visit | Attending: Infectious Disease | Admitting: Infectious Disease

## 2013-08-03 VITALS — BP 152/80 | HR 67 | Temp 97.6°F | Wt 187.0 lb

## 2013-08-03 DIAGNOSIS — N183 Chronic kidney disease, stage 3 unspecified: Secondary | ICD-10-CM

## 2013-08-03 DIAGNOSIS — I10 Essential (primary) hypertension: Secondary | ICD-10-CM

## 2013-08-03 DIAGNOSIS — Z113 Encounter for screening for infections with a predominantly sexual mode of transmission: Secondary | ICD-10-CM | POA: Insufficient documentation

## 2013-08-03 DIAGNOSIS — A539 Syphilis, unspecified: Secondary | ICD-10-CM

## 2013-08-03 DIAGNOSIS — B20 Human immunodeficiency virus [HIV] disease: Secondary | ICD-10-CM

## 2013-08-03 DIAGNOSIS — R21 Rash and other nonspecific skin eruption: Secondary | ICD-10-CM | POA: Insufficient documentation

## 2013-08-03 LAB — RPR: RPR: REACTIVE — AB

## 2013-08-03 LAB — RPR TITER: RPR Titer: 1:512 {titer} — AB

## 2013-08-03 NOTE — Progress Notes (Signed)
Subjective:    Patient ID: Carlos Hughes, male    DOB: 09/07/1970, 43 y.o.   MRN: [id number]  HPI  Carlos Hughes is a 43 y.o. male with HIV infection who is doing superbly well on HIS  antiviral regimen, epzicom, reyataz, norvir with undetectable viral load and health cd4 count.    He has been following with [facility] now with Sabra Heckyan Sanford, MD.  He had one main complaint today and that was of a bumpy rash on his arms thqat he said was dx as being syphilis before and which resolved with IM PCN shots. I noted his RPR in December only 1:1. He had unprotected oral sex 2 weeks ago but no unprotected insertive or receptive anal intercourse.   I recommended rechecking RPR titer and if negative considering brief course of topical steroids vs Dermatology referral  He also asked that he could use megace "from time to tim to help my face fill out when it is getting thinner"    Review of Systems  Constitutional: Negative for fever, chills, diaphoresis, activity change, appetite change, fatigue and unexpected weight change.  HENT: Negative for congestion, rhinorrhea, sinus pressure, sneezing, sore throat and trouble swallowing.   Eyes: Negative for photophobia and visual disturbance.  Respiratory: Negative for cough, chest tightness, shortness of breath, wheezing and stridor.   Cardiovascular: Negative for palpitations and leg swelling.  Gastrointestinal: Negative for nausea, vomiting, abdominal pain, diarrhea, constipation, blood in stool, abdominal distention and anal bleeding.  Genitourinary: Negative for dysuria, hematuria, flank pain and difficulty urinating.  Musculoskeletal: Negative for arthralgias, back pain, gait problem, joint swelling and myalgias.  Skin: Positive for rash. Negative for color change, pallor and wound.  Neurological: Negative for dizziness, tremors, weakness and light-headedness.  Hematological: Negative for adenopathy. Does not bruise/bleed easily.    Psychiatric/Behavioral: Negative for behavioral problems, confusion, sleep disturbance, dysphoric mood, decreased concentration and agitation.       Objective:   Physical Exam  Constitutional: He is oriented to person, place, and time. He appears well-developed and well-nourished. No distress.  HENT:  Head: Normocephalic and atraumatic.  Eyes: Conjunctivae and EOM are normal.  Neck: Normal range of motion. Neck supple.  Cardiovascular: Normal rate and regular rhythm.   Pulmonary/Chest: Effort normal. No respiratory distress. He has no wheezes.  Abdominal: Soft. He exhibits no distension.  Musculoskeletal: He exhibits no edema and no tenderness.  Neurological: He is alert and oriented to person, place, and time. He exhibits normal muscle tone. Coordination normal.  Skin: Skin is warm and dry. Rash noted. He is not diaphoretic. No erythema. No pallor.  Psychiatric: He has a normal mood and affect. His behavior is normal. Judgment and thought content normal.   Raised papular rash on his forearms, ? Folliculitis:           Assessment & Plan:   HIV: continue current regimen. He had problems with compliance > year ago but now doing great.  HTN: continue meds, fu with [facility] and needs pCP. GFR at 52, [facility] if holds steady. Could also go to Evotaz plus epzicom though the COBI is not approved for his GFR due to it artificially elevating the serum creatinine. I would be comfortable making such a change if renal was and we understood that serum cr would only go up by .14 artefactually. Actually the DTG in TRIUMEQ would also produce similar effect and similary not effect the true GFR. For now continue current regimen.  I spent greater than 25 minutes with the patient including greater than 50% of time in face to face counsel of the patient and in coordination of their care.  Rash: recheck RPR with prozone. Consider steroids if negative. PCN if titer has  gone up significantly  CKD: likely due to HTN poorly controlled renal US is pending. Greatly appreciate Nephrology's help

## 2013-08-04 LAB — URINE CYTOLOGY ANCILLARY ONLY
Chlamydia: NEGATIVE
Neisseria Gonorrhea: NEGATIVE

## 2013-08-04 LAB — T.PALLIDUM AB, TOTAL: T pallidum Antibodies (TP-PA): 4.71 S/CO — ABNORMAL HIGH (ref <0.90)

## 2013-08-04 NOTE — Telephone Encounter (Signed)
Carlos Hughes's RPR is 512!    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

## 2013-08-06 ENCOUNTER — Telehealth: Payer: Self-pay | Admitting: Licensed Clinical Social Worker

## 2013-08-06 NOTE — Telephone Encounter (Signed)
Patient called about his  rpr results and it was positive per Dr. Daiva EvesVan Dam and he will need to be treated with 2.4 million units of Bicillin once. Patient is aware and will be coming for treatment on 2/23.

## 2013-08-09 ENCOUNTER — Ambulatory Visit (INDEPENDENT_AMBULATORY_CARE_PROVIDER_SITE_OTHER): Payer: Medicaid Other | Admitting: Licensed Clinical Social Worker

## 2013-08-09 DIAGNOSIS — A539 Syphilis, unspecified: Secondary | ICD-10-CM

## 2013-08-09 MED ORDER — PENICILLIN G BENZATHINE 1200000 UNIT/2ML IM SUSP
1.2000 10*6.[IU] | Freq: Once | INTRAMUSCULAR | Status: AC
Start: 2013-08-09 — End: 2013-08-09
  Administered 2013-08-09: 1.2 10*6.[IU] via INTRAMUSCULAR

## 2013-08-09 MED ORDER — PENICILLIN G BENZATHINE 1200000 UNIT/2ML IM SUSP
1.2000 10*6.[IU] | Freq: Once | INTRAMUSCULAR | Status: AC
  Administered 2013-08-09: 1.2 10*6.[IU] via INTRAMUSCULAR

## 2013-08-13 ENCOUNTER — Other Ambulatory Visit: Payer: Self-pay | Admitting: Infectious Disease

## 2013-09-30 ENCOUNTER — Other Ambulatory Visit: Payer: Self-pay | Admitting: Infectious Disease

## 2013-12-16 ENCOUNTER — Other Ambulatory Visit: Payer: Self-pay | Admitting: Infectious Disease

## 2013-12-16 DIAGNOSIS — B20 Human immunodeficiency virus [HIV] disease: Secondary | ICD-10-CM

## 2014-01-19 ENCOUNTER — Other Ambulatory Visit (INDEPENDENT_AMBULATORY_CARE_PROVIDER_SITE_OTHER): Payer: Medicaid Other

## 2014-01-19 DIAGNOSIS — R21 Rash and other nonspecific skin eruption: Secondary | ICD-10-CM

## 2014-01-19 DIAGNOSIS — B20 Human immunodeficiency virus [HIV] disease: Secondary | ICD-10-CM

## 2014-01-19 LAB — COMPLETE METABOLIC PANEL WITH GFR
ALK PHOS: 54 U/L (ref 39–117)
ALT: 17 U/L (ref 0–53)
AST: 21 U/L (ref 0–37)
Albumin: 4 g/dL (ref 3.5–5.2)
BUN: 9 mg/dL (ref 6–23)
CO2: 25 mEq/L (ref 19–32)
Calcium: 9.8 mg/dL (ref 8.4–10.5)
Chloride: 106 mEq/L (ref 96–112)
Creat: 1.78 mg/dL — ABNORMAL HIGH (ref 0.50–1.35)
GFR, Est African American: 53 mL/min — ABNORMAL LOW
GFR, Est Non African American: 46 mL/min — ABNORMAL LOW
Glucose, Bld: 101 mg/dL — ABNORMAL HIGH (ref 70–99)
POTASSIUM: 3.9 meq/L (ref 3.5–5.3)
SODIUM: 138 meq/L (ref 135–145)
TOTAL PROTEIN: 7 g/dL (ref 6.0–8.3)
Total Bilirubin: 5.3 mg/dL — ABNORMAL HIGH (ref 0.2–1.2)

## 2014-01-19 LAB — RPR TITER

## 2014-01-19 LAB — RPR: RPR Ser Ql: REACTIVE — AB

## 2014-01-19 LAB — CBC WITH DIFFERENTIAL/PLATELET
BASOS ABS: 0 10*3/uL (ref 0.0–0.1)
BASOS PCT: 1 % (ref 0–1)
Eosinophils Absolute: 0 10*3/uL (ref 0.0–0.7)
Eosinophils Relative: 1 % (ref 0–5)
HCT: 39.9 % (ref 39.0–52.0)
Hemoglobin: 14 g/dL (ref 13.0–17.0)
LYMPHS PCT: 38 % (ref 12–46)
Lymphs Abs: 1.6 10*3/uL (ref 0.7–4.0)
MCH: 33.2 pg (ref 26.0–34.0)
MCHC: 35.1 g/dL (ref 30.0–36.0)
MCV: 94.5 fL (ref 78.0–100.0)
Monocytes Absolute: 0.3 10*3/uL (ref 0.1–1.0)
Monocytes Relative: 6 % (ref 3–12)
NEUTROS ABS: 2.3 10*3/uL (ref 1.7–7.7)
NEUTROS PCT: 54 % (ref 43–77)
Platelets: 141 10*3/uL — ABNORMAL LOW (ref 150–400)
RBC: 4.22 MIL/uL (ref 4.22–5.81)
RDW: 15.7 % — AB (ref 11.5–15.5)
WBC: 4.2 10*3/uL (ref 4.0–10.5)

## 2014-01-19 LAB — MICROALBUMIN / CREATININE URINE RATIO
Creatinine, Urine: 210.4 mg/dL
MICROALB UR: 52.04 mg/dL — AB (ref 0.00–1.89)
Microalb Creat Ratio: 247.3 mg/g — ABNORMAL HIGH (ref 0.0–30.0)

## 2014-01-20 LAB — FLUORESCENT TREPONEMAL AB(FTA)-IGG-BLD: Fluorescent Treponemal ABS: REACTIVE — AB

## 2014-01-20 LAB — T-HELPER CELL (CD4) - (RCID CLINIC ONLY)
CD4 % Helper T Cell: 31 % — ABNORMAL LOW (ref 33–55)
CD4 T CELL ABS: 510 /uL (ref 400–2700)

## 2014-01-21 LAB — HIV-1 RNA QUANT-NO REFLEX-BLD

## 2014-01-25 ENCOUNTER — Telehealth: Payer: Self-pay | Admitting: *Deleted

## 2014-01-25 NOTE — Telephone Encounter (Signed)
Prior Authorization for Catapres 0.3 mg /day weekly patch unnecessary.  RN noted that Catapres 0.2 mg/day, weekly patch also existed on the pt's medication profile.  Per [facility] the patient has been obtaining both patches.  MD advise if this is correct dosing.

## 2014-01-25 NOTE — Telephone Encounter (Signed)
I would check with the patient to see what he is actually doing. I think Nephrology may have actually been the ones that started this and continued it

## 2014-01-26 ENCOUNTER — Other Ambulatory Visit: Payer: Self-pay | Admitting: Infectious Disease

## 2014-01-26 MED ORDER — CLONIDINE HCL 0.2 MG/24HR TD PTWK: MEDICATED_PATCH | TRANSDERMAL | Status: DC

## 2014-01-26 NOTE — Telephone Encounter (Signed)
Pt states he is currently using the 0.2 mg patch.  The refills have been done by this office.  The pt has a follow-up appt with Dr. Daiva EvesVan Dam 02/02/14.  Dr. Daiva EvesVan Dam please clarify and correct the pt's medication profile.  The patient's pharmacy will be notified once corrections have been made due to pharmacy having both patches on file for the patient.

## 2014-01-26 NOTE — Telephone Encounter (Signed)
Fine to go with the clonidine 0.2mg  patches

## 2014-01-26 NOTE — Telephone Encounter (Signed)
Phone call to [facility].  Clonidine 0.3 mg discontinued by Dr. Daiva EvesVan Dam.  Pharmacist verbalized understanding and d/ced the medication.

## 2014-02-02 ENCOUNTER — Ambulatory Visit (INDEPENDENT_AMBULATORY_CARE_PROVIDER_SITE_OTHER): Payer: Medicaid Other | Admitting: Infectious Disease

## 2014-02-02 ENCOUNTER — Encounter: Payer: Self-pay | Admitting: Infectious Disease

## 2014-02-02 VITALS — BP 157/97 | HR 79 | Temp 97.5°F | Wt 185.0 lb

## 2014-02-02 DIAGNOSIS — I10 Essential (primary) hypertension: Secondary | ICD-10-CM

## 2014-02-02 DIAGNOSIS — A529 Late syphilis, unspecified: Secondary | ICD-10-CM

## 2014-02-02 DIAGNOSIS — B20 Human immunodeficiency virus [HIV] disease: Secondary | ICD-10-CM

## 2014-02-02 DIAGNOSIS — N183 Chronic kidney disease, stage 3 unspecified: Secondary | ICD-10-CM

## 2014-02-02 MED ORDER — ABACAVIR-DOLUTEGRAVIR-LAMIVUD 600-50-300 MG PO TABS: 1.0000 | ORAL_TABLET | Freq: Every day | ORAL | Status: AC

## 2014-02-02 MED ORDER — PENICILLIN G BENZATHINE 1200000 UNIT/2ML IM SUSP
1.2000 10*6.[IU] | Freq: Once | INTRAMUSCULAR | Status: AC
  Administered 2014-02-02: 1.2 10*6.[IU] via INTRAMUSCULAR

## 2014-02-02 NOTE — Progress Notes (Signed)
Subjective:    Patient ID: Carlos Hughes, male    DOB: 02/05/1971, 43 y.o.   MRN: [id number]  HPI   Carlos Hughes is a 43 y.o. male with HIV infection who is doing superbly well on HIS  antiviral regimen, epzicom, reyataz, norvir with undetectable viral load and health cd4 count.   He has been following with [facility] now with Sabra Heckyan Sanford, MD.  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 We DID NOT at that time look for Neurosyphilis but gave 1 shot of IM PCN.  I told him that NOW that his titer has NOT dropped AT ALL and is still at 1:512 that we should investigate OR treat for Neurosyphilis.   He ultimately would not agree to LP or empiric treatment for Neurosyphilis but wanted another round of IM PCN x 3. I dont think this is the right thing to do but ultimately this was the patients decision.  Review of Systems  Constitutional: Negative for fever, chills, diaphoresis, activity change, appetite change, fatigue and unexpected weight change.  HENT: Negative for congestion, rhinorrhea, sinus pressure, sneezing, sore throat and trouble swallowing.   Eyes: Negative for photophobia and visual disturbance.  Respiratory: Negative for cough, chest tightness, shortness of breath, wheezing and stridor.   Cardiovascular: Negative for palpitations and leg swelling.  Gastrointestinal: Negative for nausea, vomiting, abdominal pain, diarrhea, constipation, blood in stool, abdominal distention and anal bleeding.  Genitourinary: Negative for dysuria, hematuria, flank pain and difficulty urinating.  Musculoskeletal: Negative for arthralgias, back pain, gait problem, joint swelling and myalgias.  Skin: Negative for color change, pallor and wound.  Neurological: Negative for dizziness, tremors, weakness and light-headedness.  Hematological: Negative for adenopathy. Does not bruise/bleed easily.  Psychiatric/Behavioral: Negative  for behavioral problems, confusion, sleep disturbance, dysphoric mood, decreased concentration and agitation.       Objective:   Physical Exam  Constitutional: He is oriented to person, place, and time. He appears well-developed and well-nourished. No distress.  HENT:  Head: Normocephalic and atraumatic.  Eyes: Conjunctivae and EOM are normal.  Neck: Normal range of motion. Neck supple.  Cardiovascular: Normal rate and regular rhythm.   Pulmonary/Chest: Effort normal. No respiratory distress. He has no wheezes.  Abdominal: Soft. He exhibits no distension.  Musculoskeletal: He exhibits no edema and no tenderness.  Neurological: He is alert and oriented to person, place, and time. He exhibits normal muscle tone. Coordination normal.  Skin: Skin is warm and dry. Rash noted. He is not diaphoretic. No erythema. No pallor.  Psychiatric: He has a normal mood and affect. His behavior is normal. Judgment and thought content normal.   Raised papular rash on his forearms, ? Folliculitis:           Assessment & Plan:   HIV: CHANGE TO TRIUMEQ, repeat labs in 2 months.    HTN: continue meds, fu with [facility] and needs pCP. 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   Will go with his wishes. If titer does NOT come down FOUR fold I will treat IV penicillin TWO WEEKS  I spent greater than 40 minutes with the patient including greater than 50% of time in face to face counsel of the patient and in coordination of their care.  I spent greater than 25 minutes with the patient including greater than 50% of time in face to face counsel of the patient and in coordination of their care.  Rash: recheck RPR with prozone. Consider steroids if negative. PCN if titer has gone up significantly  CKD: following with [facility]

## 2014-02-09 ENCOUNTER — Ambulatory Visit (INDEPENDENT_AMBULATORY_CARE_PROVIDER_SITE_OTHER): Payer: Medicaid Other | Admitting: *Deleted

## 2014-02-09 DIAGNOSIS — A539 Syphilis, unspecified: Secondary | ICD-10-CM

## 2014-02-09 MED ORDER — PENICILLIN G BENZATHINE 1200000 UNIT/2ML IM SUSP
1.2000 10*6.[IU] | Freq: Once | INTRAMUSCULAR | Status: AC
  Administered 2014-02-09: 1.2 10*6.[IU] via INTRAMUSCULAR

## 2014-02-16 ENCOUNTER — Ambulatory Visit (INDEPENDENT_AMBULATORY_CARE_PROVIDER_SITE_OTHER): Payer: Medicaid Other | Admitting: *Deleted

## 2014-02-16 DIAGNOSIS — A539 Syphilis, unspecified: Secondary | ICD-10-CM

## 2014-02-16 MED ORDER — PENICILLIN G BENZATHINE 1200000 UNIT/2ML IM SUSP
1.2000 10*6.[IU] | Freq: Once | INTRAMUSCULAR | Status: AC
  Administered 2014-02-16: 1.2 10*6.[IU] via INTRAMUSCULAR

## 2014-02-23 ENCOUNTER — Other Ambulatory Visit: Payer: Self-pay | Admitting: *Deleted

## 2014-02-23 DIAGNOSIS — I10 Essential (primary) hypertension: Secondary | ICD-10-CM

## 2014-02-23 MED ORDER — LISINOPRIL 40 MG PO TABS: ORAL_TABLET | ORAL | Status: DC

## 2014-03-15 IMAGING — CR DG HAND COMPLETE 3+V*R*
3 series · 3 of 3 positions shown · non-contrast
Comparison: None.

CLINICAL DATA: Mild pain and stiffness in all joints of the right
hand.

EXAM:
RIGHT HAND - COMPLETE 3+ VIEW

[view not recorded (1 of 3)]
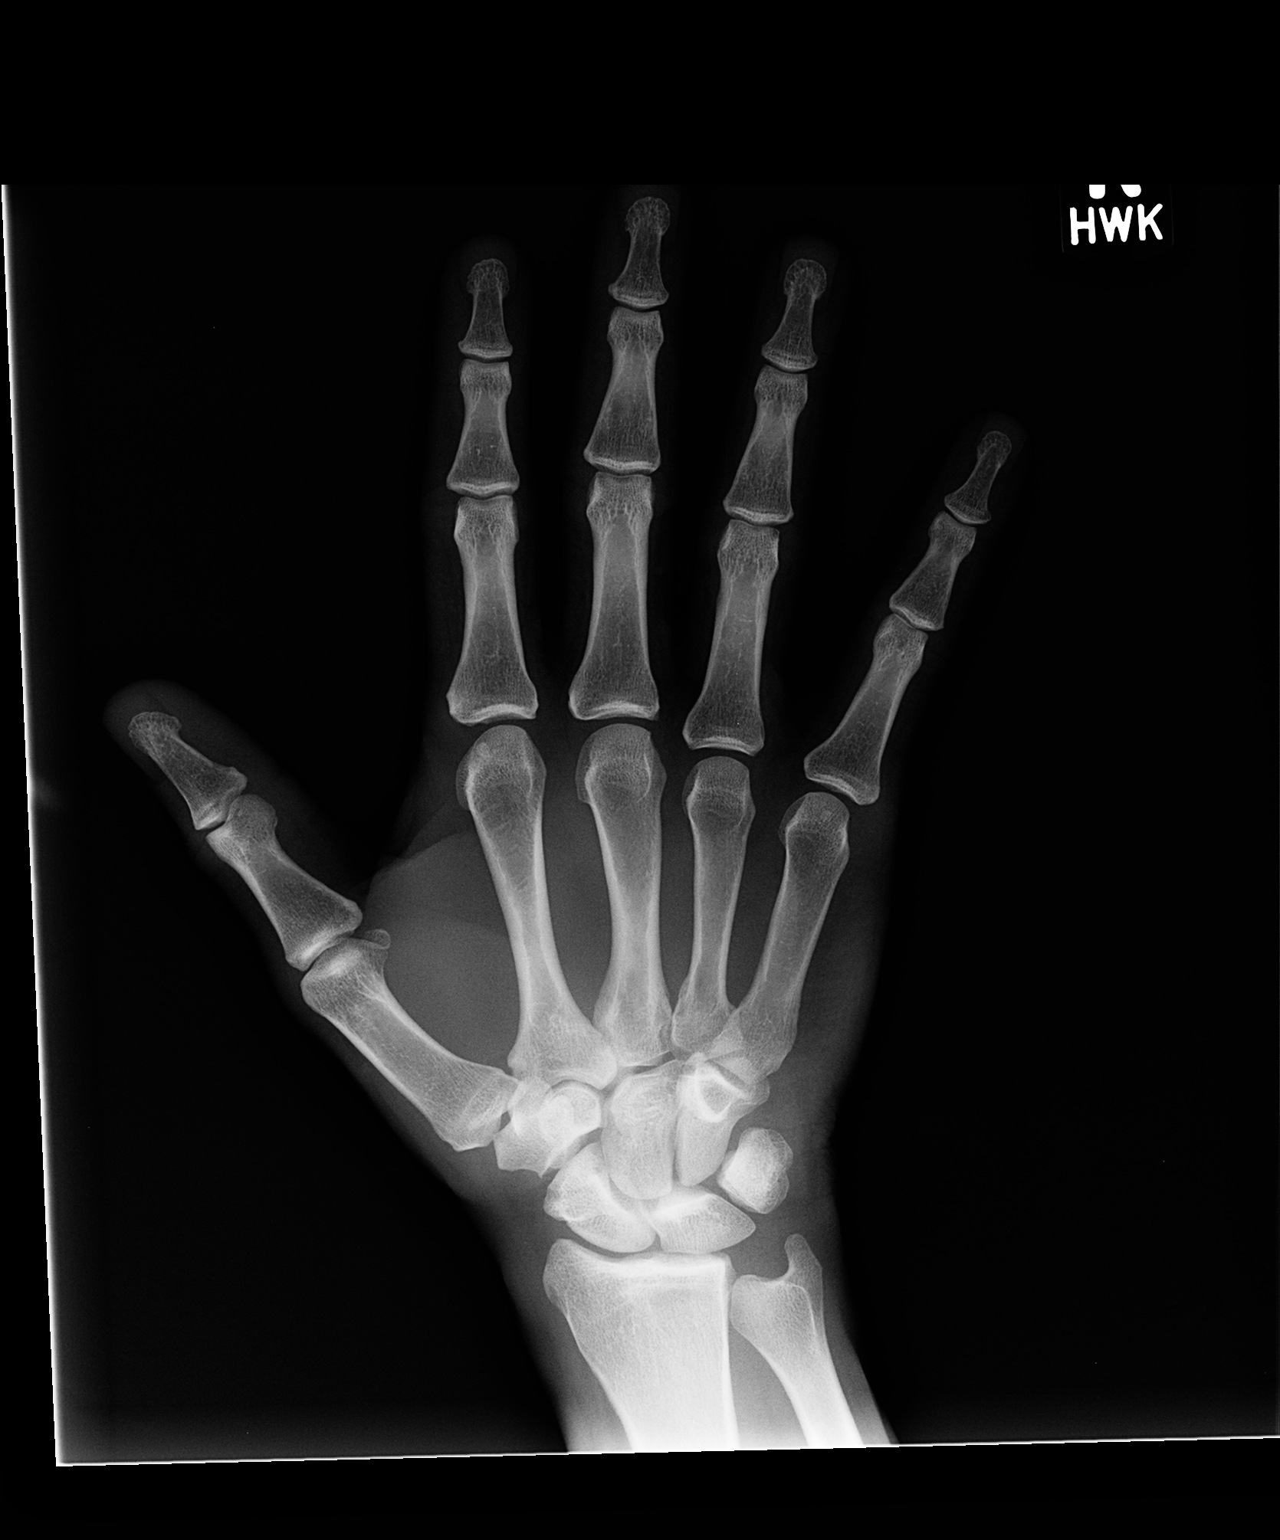

[view not recorded (2 of 3)]
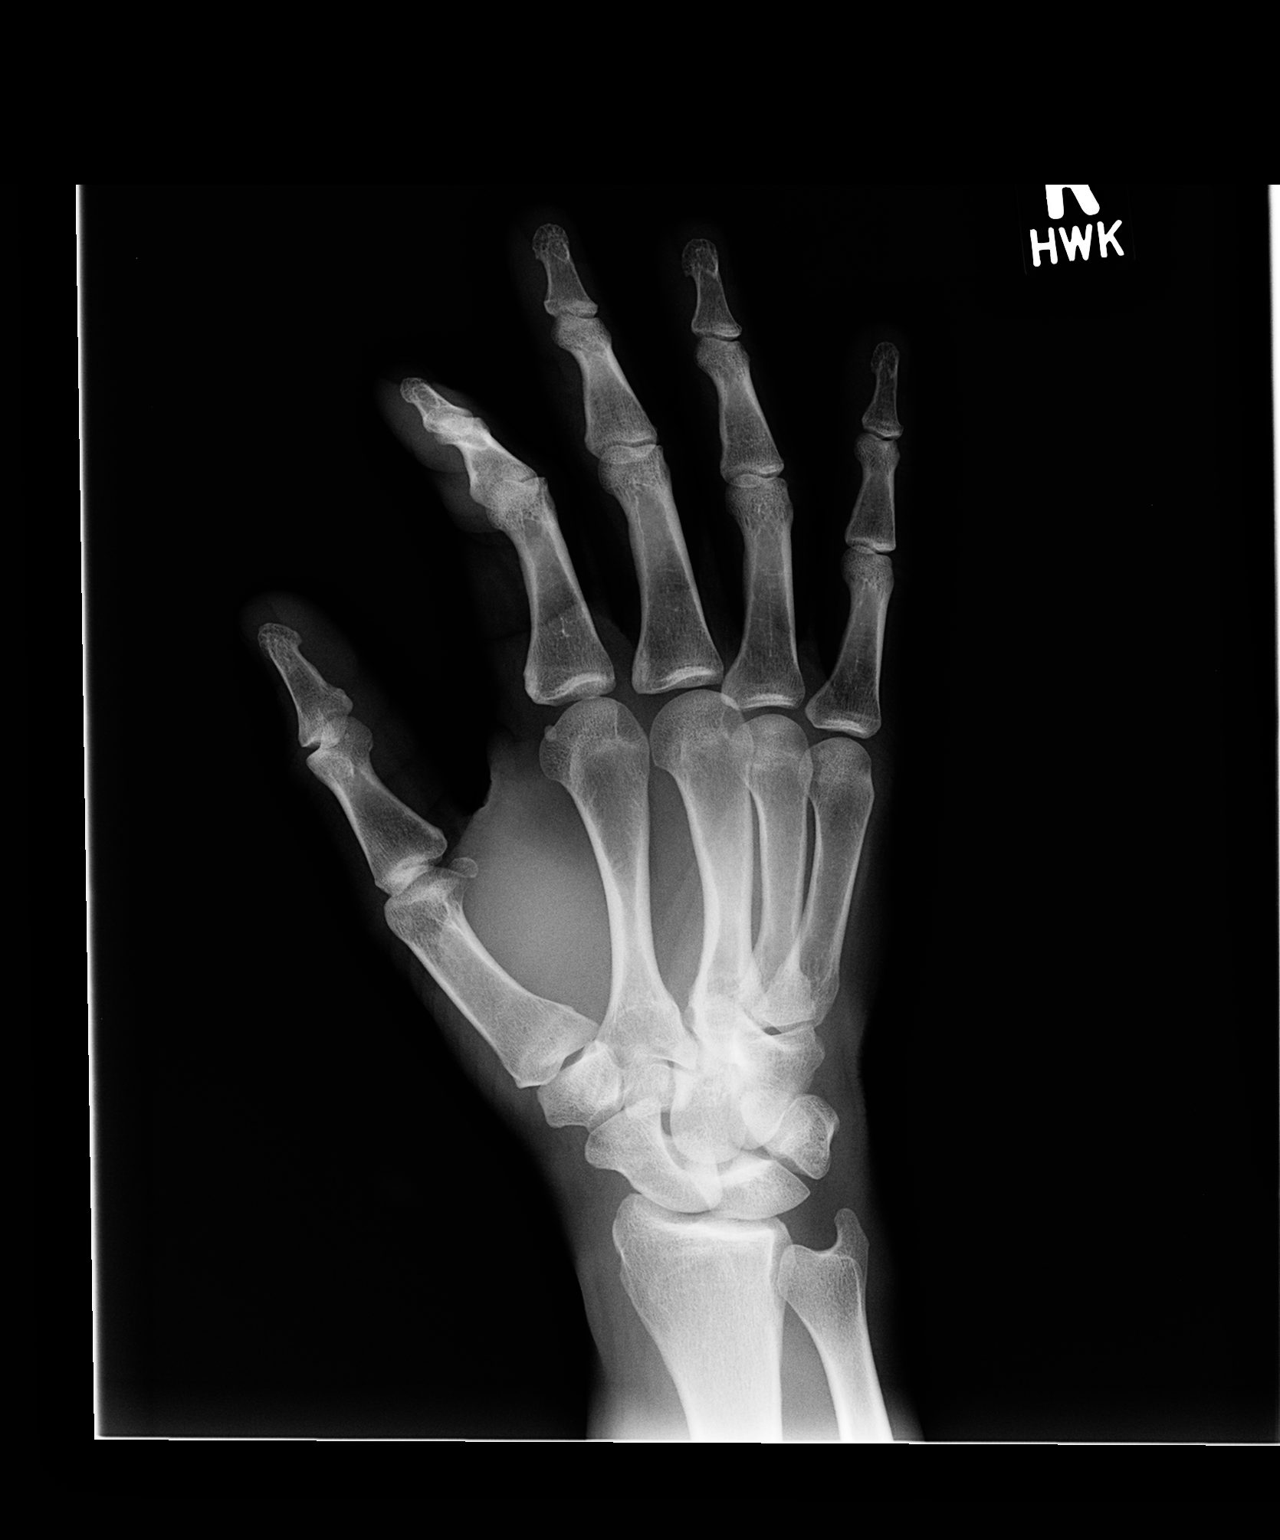

[view not recorded (3 of 3)]
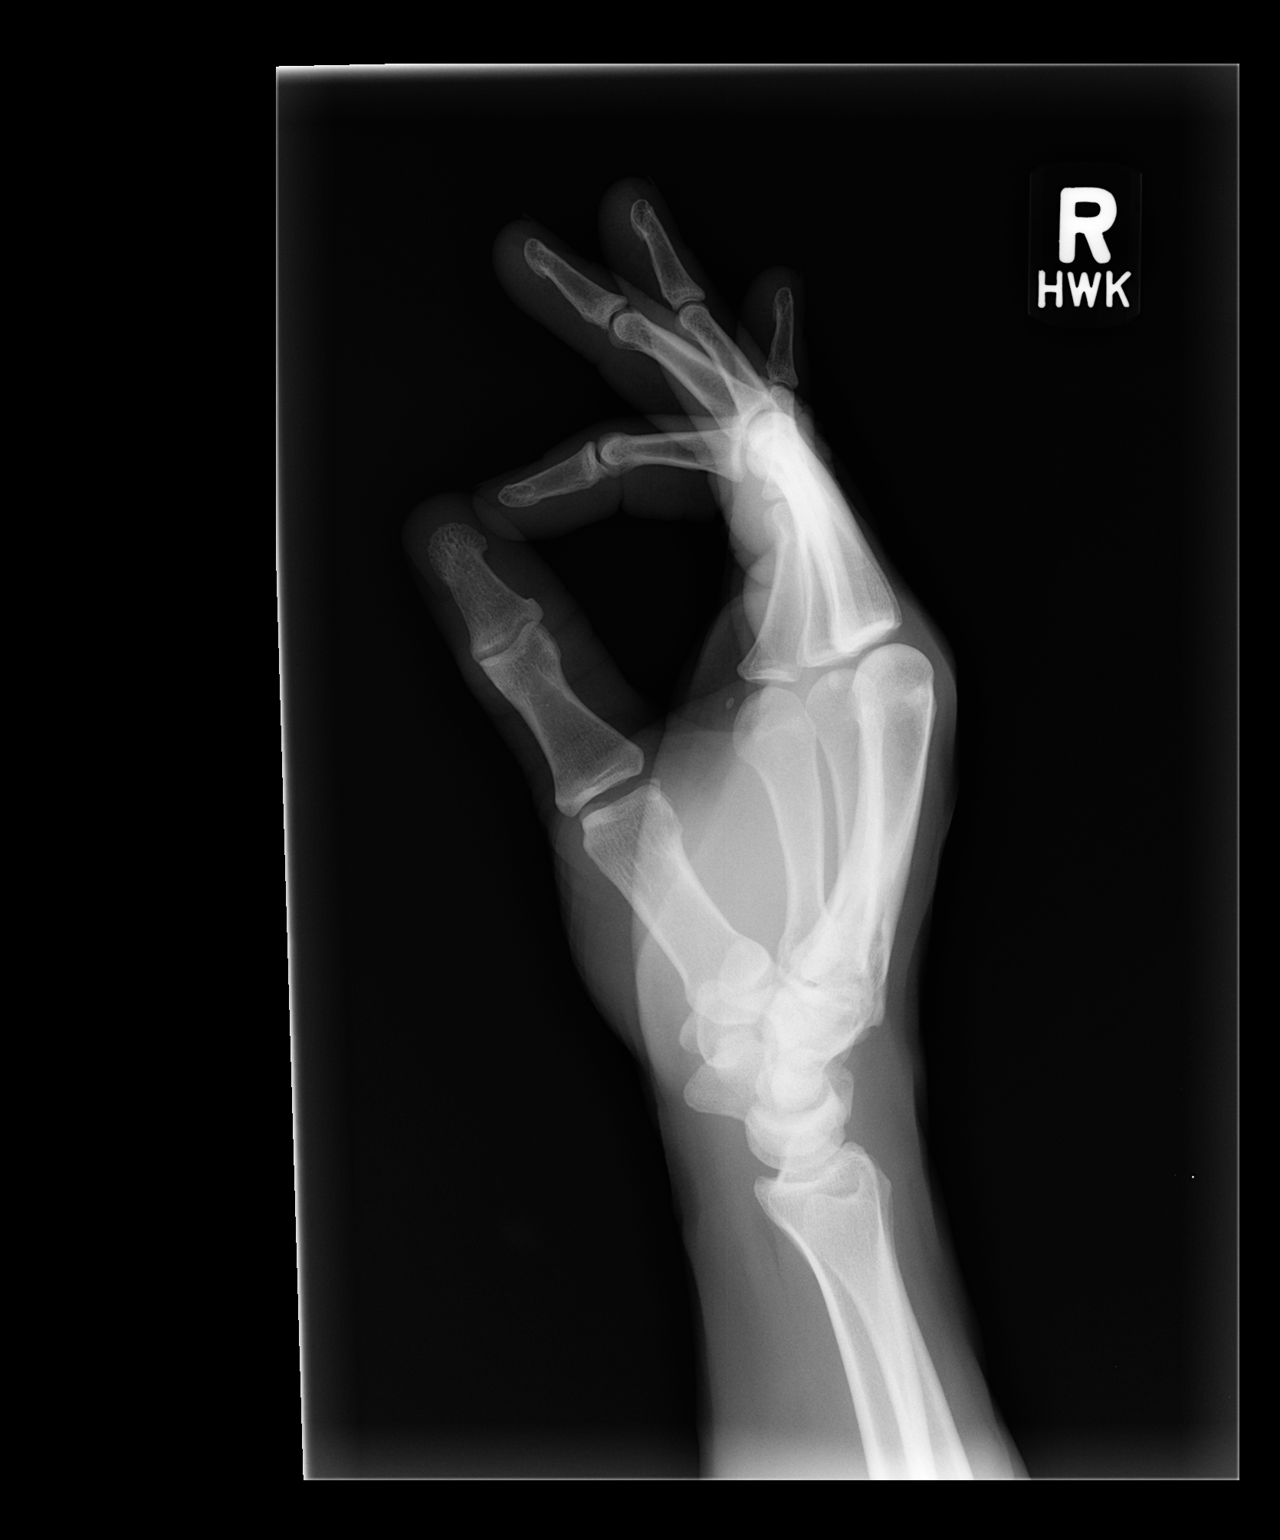

[3 of 3 positions shown; findings below may reference images not displayed]

FINDINGS: There is no fracture or dislocation. Joint spaces and alignment are
maintained. No erosion is identified. No abnormal soft tissue
calcification is seen. Ulnar minus variance is noted.
IMPRESSION: Mild ulnar minus variance. The study is otherwise negative. No
finding to explain the patient's joint pain is identified.

## 2014-03-21 ENCOUNTER — Other Ambulatory Visit (INDEPENDENT_AMBULATORY_CARE_PROVIDER_SITE_OTHER): Payer: Medicaid Other

## 2014-03-21 ENCOUNTER — Other Ambulatory Visit (HOSPITAL_COMMUNITY)
Admission: RE | Admit: 2014-03-21 | Discharge: 2014-03-21 | Disposition: A | Discharge status: Home / Self Care | Payer: Medicaid Other | Source: Ambulatory Visit | Attending: Infectious Disease | Admitting: Infectious Disease

## 2014-03-21 DIAGNOSIS — B20 Human immunodeficiency virus [HIV] disease: Secondary | ICD-10-CM

## 2014-03-21 DIAGNOSIS — Z113 Encounter for screening for infections with a predominantly sexual mode of transmission: Secondary | ICD-10-CM

## 2014-03-21 DIAGNOSIS — E785 Hyperlipidemia, unspecified: Secondary | ICD-10-CM

## 2014-03-21 LAB — CBC WITH DIFFERENTIAL/PLATELET
Basophils Absolute: 0 10*3/uL (ref 0.0–0.1)
Basophils Relative: 0 % (ref 0–1)
Eosinophils Absolute: 0.1 10*3/uL (ref 0.0–0.7)
Eosinophils Relative: 2 % (ref 0–5)
HEMATOCRIT: 41.9 % (ref 39.0–52.0)
HEMOGLOBIN: 15 g/dL (ref 13.0–17.0)
LYMPHS PCT: 36 % (ref 12–46)
Lymphs Abs: 1.5 10*3/uL (ref 0.7–4.0)
MCH: 31.7 pg (ref 26.0–34.0)
MCHC: 35.8 g/dL (ref 30.0–36.0)
MCV: 88.6 fL (ref 78.0–100.0)
MONO ABS: 0.3 10*3/uL (ref 0.1–1.0)
Monocytes Relative: 7 % (ref 3–12)
NEUTROS ABS: 2.4 10*3/uL (ref 1.7–7.7)
NEUTROS PCT: 55 % (ref 43–77)
Platelets: 164 10*3/uL (ref 150–400)
RBC: 4.73 MIL/uL (ref 4.22–5.81)
RDW: 16.2 % — AB (ref 11.5–15.5)
WBC: 4.3 10*3/uL (ref 4.0–10.5)

## 2014-03-22 LAB — COMPLETE METABOLIC PANEL WITH GFR
ALBUMIN: 3.9 g/dL (ref 3.5–5.2)
ALK PHOS: 57 U/L (ref 39–117)
ALT: 44 U/L (ref 0–53)
AST: 38 U/L — ABNORMAL HIGH (ref 0–37)
BUN: 16 mg/dL (ref 6–23)
CALCIUM: 10.1 mg/dL (ref 8.4–10.5)
CHLORIDE: 104 meq/L (ref 96–112)
CO2: 26 mEq/L (ref 19–32)
Creat: 1.94 mg/dL — ABNORMAL HIGH (ref 0.50–1.35)
GFR, Est African American: 48 mL/min — ABNORMAL LOW
GFR, Est Non African American: 41 mL/min — ABNORMAL LOW
Glucose, Bld: 92 mg/dL (ref 70–99)
POTASSIUM: 4 meq/L (ref 3.5–5.3)
Sodium: 137 mEq/L (ref 135–145)
Total Bilirubin: 0.7 mg/dL (ref 0.2–1.2)
Total Protein: 7.2 g/dL (ref 6.0–8.3)

## 2014-03-22 LAB — T-HELPER CELL (CD4) - (RCID CLINIC ONLY)
CD4 T CELL HELPER: 34 % (ref 33–55)
CD4 T Cell Abs: 560 /uL (ref 400–2700)

## 2014-03-22 LAB — URINE CYTOLOGY ANCILLARY ONLY
CHLAMYDIA, DNA PROBE: NEGATIVE
Neisseria Gonorrhea: NEGATIVE

## 2014-03-22 LAB — HIV-1 RNA QUANT-NO REFLEX-BLD

## 2014-03-22 LAB — RPR: RPR Ser Ql: REACTIVE — AB

## 2014-03-22 LAB — LIPID PANEL
CHOL/HDL RATIO: 6.3 ratio
Cholesterol: 151 mg/dL (ref 0–200)
HDL: 24 mg/dL — ABNORMAL LOW (ref >39)
LDL CALC: 74 mg/dL (ref 0–99)
Triglycerides: 265 mg/dL — ABNORMAL HIGH (ref <150)
VLDL: 53 mg/dL — ABNORMAL HIGH (ref 0–40)

## 2014-03-22 LAB — FLUORESCENT TREPONEMAL AB(FTA)-IGG-BLD: Fluorescent Treponemal ABS: REACTIVE — AB

## 2014-03-22 LAB — RPR TITER: RPR Titer: 1:128 {titer} — AB

## 2014-03-22 NOTE — Progress Notes (Signed)
Patient notified per Dr. Daiva EvesVan Dam that RPR is going down and does not need an LP at this time, we will retest in a few months.

## 2014-03-23 ENCOUNTER — Other Ambulatory Visit: Payer: Self-pay | Admitting: *Deleted

## 2014-03-23 MED ORDER — METOPROLOL TARTRATE 100 MG PO TABS: ORAL_TABLET | ORAL | Status: DC

## 2014-03-31 ENCOUNTER — Emergency Department (HOSPITAL_COMMUNITY)
Admission: EM | Admit: 2014-03-31 | Discharge: 2014-03-31 | Disposition: A | Discharge status: Home / Self Care | Payer: Medicaid Other | Attending: Emergency Medicine | Admitting: Emergency Medicine

## 2014-03-31 ENCOUNTER — Encounter (HOSPITAL_COMMUNITY): Payer: Self-pay | Admitting: Emergency Medicine

## 2014-03-31 DIAGNOSIS — R079 Chest pain, unspecified: Secondary | ICD-10-CM | POA: Insufficient documentation

## 2014-03-31 DIAGNOSIS — I129 Hypertensive chronic kidney disease with stage 1 through stage 4 chronic kidney disease, or unspecified chronic kidney disease: Secondary | ICD-10-CM | POA: Insufficient documentation

## 2014-03-31 DIAGNOSIS — N189 Chronic kidney disease, unspecified: Secondary | ICD-10-CM | POA: Diagnosis not present

## 2014-03-31 DIAGNOSIS — J069 Acute upper respiratory infection, unspecified: Secondary | ICD-10-CM | POA: Diagnosis not present

## 2014-03-31 DIAGNOSIS — B20 Human immunodeficiency virus [HIV] disease: Secondary | ICD-10-CM | POA: Diagnosis not present

## 2014-03-31 DIAGNOSIS — Z79899 Other long term (current) drug therapy: Secondary | ICD-10-CM | POA: Insufficient documentation

## 2014-03-31 DIAGNOSIS — J029 Acute pharyngitis, unspecified: Secondary | ICD-10-CM | POA: Diagnosis present

## 2014-03-31 LAB — RAPID STREP SCREEN (MED CTR MEBANE ONLY): Streptococcus, Group A Screen (Direct): NEGATIVE

## 2014-03-31 MED ORDER — PSEUDOEPHEDRINE HCL 60 MG PO TABS: 60.0000 mg | ORAL_TABLET | Freq: Four times a day (QID) | ORAL | Status: AC | PRN

## 2014-03-31 NOTE — Discharge Instructions (Signed)
Upper Respiratory Infection, Adult An upper respiratory infection (URI) is also sometimes known as the common cold. The upper respiratory tract includes the nose, sinuses, throat, trachea, and bronchi. Bronchi are the airways leading to the lungs. Most people improve within 1 week, but symptoms can last up to 2 weeks. A residual cough may last even longer.  CAUSES Many different viruses can infect the tissues lining the upper respiratory tract. The tissues become irritated and inflamed and often become very moist. Mucus production is also common. A cold is contagious. You can easily spread the virus to others by oral contact. This includes kissing, sharing a glass, coughing, or sneezing. Touching your mouth or nose and then touching a surface, which is then touched by another person, can also spread the virus. SYMPTOMS  Symptoms typically develop 1 to 3 days after you come in contact with a cold virus. Symptoms vary from person to person. They may include:  Runny nose.  Sneezing.  Nasal congestion.  Sinus irritation.  Sore throat.  Loss of voice (laryngitis).  Cough.  Fatigue.  Muscle aches.  Loss of appetite.  Headache.  Low-grade fever. DIAGNOSIS  You might diagnose your own cold based on familiar symptoms, since most people get a cold 2 to 3 times a year. Your caregiver can confirm this based on your exam. Most importantly, your caregiver can check that your symptoms are not due to another disease such as strep throat, sinusitis, pneumonia, asthma, or epiglottitis. Blood tests, throat tests, and X-rays are not necessary to diagnose a common cold, but they may sometimes be helpful in excluding other more serious diseases. Your caregiver will decide if any further tests are required. RISKS AND COMPLICATIONS  You may be at risk for a more severe case of the common cold if you smoke cigarettes, have chronic heart disease (such as heart failure) or lung disease (such as asthma), or if  you have a weakened immune system. The very young and very old are also at risk for more serious infections. Bacterial sinusitis, middle ear infections, and bacterial pneumonia can complicate the common cold. The common cold can worsen asthma and chronic obstructive pulmonary disease (COPD). Sometimes, these complications can require emergency medical care and may be life-threatening. PREVENTION  The best way to protect against getting a cold is to practice good hygiene. Avoid oral or hand contact with people with cold symptoms. Wash your hands often if contact occurs. There is no clear evidence that vitamin C, vitamin E, echinacea, or exercise reduces the chance of developing a cold. However, it is always recommended to get plenty of rest and practice good nutrition. TREATMENT  Treatment is directed at relieving symptoms. There is no cure. Antibiotics are not effective, because the infection is caused by a virus, not by bacteria. Treatment may include:  Increased fluid intake. Sports drinks offer valuable electrolytes, sugars, and fluids.  Breathing heated mist or steam (vaporizer or shower).  Eating chicken soup or other clear broths, and maintaining good nutrition.  Getting plenty of rest.  Using gargles or lozenges for comfort.  Controlling fevers with ibuprofen or acetaminophen as directed by your caregiver.  Increasing usage of your inhaler if you have asthma. Zinc gel and zinc lozenges, taken in the first 24 hours of the common cold, can shorten the duration and lessen the severity of symptoms. Pain medicines may help with fever, muscle aches, and throat pain. A variety of non-prescription medicines are available to treat congestion and runny nose. Your caregiver   can make recommendations and may suggest nasal or lung inhalers for other symptoms.  HOME CARE INSTRUCTIONS   Only take over-the-counter or prescription medicines for pain, discomfort, or fever as directed by your  caregiver.  Use a warm mist humidifier or inhale steam from a shower to increase air moisture. This may keep secretions moist and make it easier to breathe.  Drink enough water and fluids to keep your urine clear or pale yellow.  Rest as needed.  Return to work when your temperature has returned to normal or as your caregiver advises. You may need to stay home longer to avoid infecting others. You can also use a face mask and careful hand washing to prevent spread of the virus. SEEK MEDICAL CARE IF:   After the first few days, you feel you are getting worse rather than better.  You need your caregiver's advice about medicines to control symptoms.  You develop chills, worsening shortness of breath, or brown or red sputum. These may be signs of pneumonia.  You develop yellow or brown nasal discharge or pain in the face, especially when you bend forward. These may be signs of sinusitis.  You develop a fever, swollen neck glands, pain with swallowing, or white areas in the back of your throat. These may be signs of strep throat. SEEK IMMEDIATE MEDICAL CARE IF:   You have a fever.  You develop severe or persistent headache, ear pain, sinus pain, or chest pain.  You develop wheezing, a prolonged cough, cough up blood, or have a change in your usual mucus (if you have chronic lung disease).  You develop sore muscles or a stiff neck. Document Released: 11/27/2000 Document Revised: 08/26/2011 Document Reviewed: 09/08/2013 Exit[employer] Patient Information 2015 [employer]. This information is not intended to replace advice given to you by your health care provider. Make sure you discuss any questions you have with your health care provider.  

## 2014-03-31 NOTE — ED Notes (Signed)
Patient states sore throat since Saturday.   Patient states started getting a cough Monday and feels like he has congestion in his chest.   Patient denies chest pain and any other symptoms.

## 2014-03-31 NOTE — ED Provider Notes (Signed)
CSN: [id number]     Arrival date & time 03/31/14  0906 History  This chart was scribed for non-physician practitioner Junious SilkHannah Lashae Wollenberg, working with Richardean Canalavid H Yao, MD by Carl Bestelina Holson, ED Scribe. This patient was seen in room TR06C/TR06C and the patient's care was started at 10:37 AM.    Chief Complaint  Patient presents with  . Sore Throat  . chest congestion     Patient is a 43 y.o. male presenting with pharyngitis. The history is provided by the patient. No language interpreter was used.  Sore Throat Pertinent negatives include no chest pain and no shortness of breath.   HPI Comments: Carlos Hughes is a 43 y.o. male with a history of HIV who presents to the Emergency Department complaining of constant sore throat that started five days ago.  He states that the pain is worse at night.  He coughs a lot when he lays down but states that the cough will resolve throughout the day.  He has taken Mucinex - with his most recent dose being last night - with mild relief to his symptoms.  He lists intermittent rhinorrhea and congestion as associated symptoms.  He denies chest pain, SOB, chills, and fever as associated symptoms.  He takes all of his viral medications and his viral load is undetectable.  His CD4 count was checked earlier this week at his PCP's office.  His PCP is Dr. Daiva EvesVan Dam. He has an appointment with him on Monday.  Past Medical History  Diagnosis Date  . Hypertension   . HIV disease   . CKD (chronic kidney disease)    Past Surgical History  Procedure Laterality Date  . None    . Rectal surgery     Family History  Problem Relation Age of Onset  . Hypertension Father   . Heart disease Father   . Diabetes Mother   . Stroke Mother   . Heart disease Sister   . Heart disease Brother   . Diabetes Sister    History  Substance Use Topics  . Smoking status: Never Smoker   . Smokeless tobacco: Not on file  . Alcohol Use: No    Review of Systems  Constitutional: Negative for  fever and chills.  HENT: Positive for congestion, rhinorrhea and sore throat.   Respiratory: Positive for cough. Negative for shortness of breath.   Cardiovascular: Negative for chest pain.  All other systems reviewed and are negative.     Allergies  Review of patient's allergies indicates no known allergies.  Home Medications   Prior to Admission medications   Medication Sig Start Date End Date Taking? Authorizing Provider  Abacavir-Dolutegravir-Lamivud (TRIUMEQ) 600-50-300 MG TABS Take 1 tablet by mouth daily. 02/02/14   Randall Hissornelius N Van Dam, MD  amLODipine (NORVASC) 5 MG tablet Take 5 mg by mouth daily.    Historical Provider, MD  ANDROGEL PUMP 20.25 MG/ACT (1.62%) GEL USE 2 PUMPS EVERY DAY 08/13/13   Randall Hissornelius N Van Dam, MD  Ascorbic Acid (VITAMIN C) 1000 MG tablet Take 1,000 mg by mouth daily.    Historical Provider, MD  cloNIDine (CATAPRES-TTS-2) 0.2 mg/24hr patch PLACE 1 PATCH (0.2 MG TOTAL) ONTO THE SKIN ONCE A WEEK. 01/26/14   Randall Hissornelius N Van Dam, MD  colchicine 0.6 MG tablet Take 0.3mg  (1/2 tablet) by mouth once per day for 1 weeks or until symptoms resolve 11/14/12   Dahlia ClientHannah Muthersbaugh, PA-C  HYDROcodone-acetaminophen (NORCO/VICODIN) 5-325 MG per tablet Take 1 tablet by mouth every 4 (four)  hours as needed for pain. 11/14/12   Rahkeem Senft Muthersbaugh, PA-C  lisinopril (PRINIVIL,ZESTRIL) 40 MG tablet TAKE 1 TABLET BY MOUTH EVERY DAY 02/23/14   Randall Hissornelius N Van Dam, MD  megestrol (MEGACE) 40 MG/ML suspension TAKE 4 TEASPOONFULS (20ML) BY MOUTH EVERY DAY 08/13/13   Randall Hissornelius N Van Dam, MD  metoprolol (LOPRESSOR) 100 MG tablet TAKE 1 TABLET BY MOUTH TWICE DAILY 03/23/14   Randall Hissornelius N Van Dam, MD  Multiple Vitamins-Minerals (MULTIVITAMIN PO) Take 1 tablet by mouth daily.    Historical Provider, MD  ritonavir (NORVIR) 100 MG capsule Take 100 mg by mouth daily.    Historical Provider, MD  Testosterone (ANDROGEL) 20.25 MG/1.25GM (1.62%) GEL Place 20.25 mg onto the skin daily. 03/19/13   Randall Hissornelius N  Van Dam, MD   BP 146/106  Pulse 58  Temp(Src) 97.8 F (36.6 C) (Oral)  Resp 18  Ht 5\' 8"  (1.727 m)  Wt 180 lb (81.647 kg)  BMI 27.38 kg/m2  SpO2 100% Physical Exam  Nursing note and vitals reviewed. Constitutional: He is oriented to person, place, and time. He appears well-developed and well-nourished. No distress.  Well appearing, no distress  HENT:  Head: Normocephalic and atraumatic.  Right Ear: Tympanic membrane, external ear and ear canal normal.  Left Ear: Tympanic membrane, external ear and ear canal normal.  Nose: Nose normal.  Mouth/Throat: Uvula is midline, oropharynx is clear and moist and mucous membranes are normal. No oropharyngeal exudate.  Eyes: Conjunctivae and EOM are normal.  Neck: Normal range of motion. No tracheal deviation present.  No nuchal rigidity or meningeal signs  Cardiovascular: Normal rate, regular rhythm, normal heart sounds, intact distal pulses and normal pulses.   No murmur heard. Pulmonary/Chest: Effort normal and breath sounds normal. No stridor. No respiratory distress. He has no wheezes. He has no rales.  Abdominal: Soft. He exhibits no distension. There is no tenderness.  Musculoskeletal: Normal range of motion.  Lymphadenopathy:    He has no cervical adenopathy.  Neurological: He is alert and oriented to person, place, and time.  Skin: Skin is warm and dry. He is not diaphoretic.  Psychiatric: He has a normal mood and affect. His behavior is normal.    ED Course  Procedures (including critical care time)  DIAGNOSTIC STUDIES: Oxygen Saturation is 100% on room air, normal by my interpretation.    COORDINATION OF CARE: 10:40 AM- Discussed a clinical suspicion of a viral infection.  Advised the patient to continue to take the Mucinex, rest, and alternate Tylenol and Ibuprofen.  Return to clinic precautions discussed.  The patient agreed to the treatment plan.  Labs Review Labs Reviewed  RAPID STREP SCREEN  CULTURE, GROUP A STREP     Imaging Review No results found.   EKG Interpretation None      MDM   Final diagnoses:  URI (upper respiratory infection)    Patients symptoms are consistent with URI, likely viral etiology. Discussed that antibiotics are not indicated for viral infections. Pt will be discharged with symptomatic treatment.  Verbalizes understanding and is agreeable with plan. Pt is hemodynamically stable & in NAD prior to dc.  I personally performed the services described in this documentation, which was scribed in my presence. The recorded information has been reviewed and is accurate.    Mora BellmanHannah S Amareon Phung, PA-C 03/31/14 1154

## 2014-04-01 NOTE — ED Provider Notes (Signed)
Medical screening examination/treatment/procedure(s) were performed by non-physician practitioner and as supervising physician I was immediately available for consultation/collaboration.   EKG Interpretation None        Richardean Canalavid H Ariauna Farabee, MD 04/01/14 [phone]

## 2014-04-02 LAB — CULTURE, GROUP A STREP

## 2014-04-04 ENCOUNTER — Encounter: Payer: Self-pay | Admitting: Infectious Disease

## 2014-04-04 ENCOUNTER — Ambulatory Visit (INDEPENDENT_AMBULATORY_CARE_PROVIDER_SITE_OTHER): Payer: Medicaid Other | Admitting: Infectious Disease

## 2014-04-04 VITALS — BP 154/93 | HR 57 | Temp 97.8°F | Wt 187.0 lb

## 2014-04-04 DIAGNOSIS — N181 Chronic kidney disease, stage 1: Secondary | ICD-10-CM

## 2014-04-04 DIAGNOSIS — Z23 Encounter for immunization: Secondary | ICD-10-CM

## 2014-04-04 DIAGNOSIS — N189 Chronic kidney disease, unspecified: Secondary | ICD-10-CM

## 2014-04-04 DIAGNOSIS — N184 Chronic kidney disease, stage 4 (severe): Secondary | ICD-10-CM

## 2014-04-04 DIAGNOSIS — B20 Human immunodeficiency virus [HIV] disease: Secondary | ICD-10-CM

## 2014-04-04 DIAGNOSIS — N183 Chronic kidney disease, stage 3 (moderate): Secondary | ICD-10-CM

## 2014-04-04 DIAGNOSIS — N185 Chronic kidney disease, stage 5: Secondary | ICD-10-CM

## 2014-04-04 DIAGNOSIS — I10 Essential (primary) hypertension: Secondary | ICD-10-CM

## 2014-04-04 DIAGNOSIS — A529 Late syphilis, unspecified: Secondary | ICD-10-CM

## 2014-04-04 DIAGNOSIS — N182 Chronic kidney disease, stage 2 (mild): Secondary | ICD-10-CM

## 2014-04-04 DIAGNOSIS — I129 Hypertensive chronic kidney disease with stage 1 through stage 4 chronic kidney disease, or unspecified chronic kidney disease: Secondary | ICD-10-CM

## 2014-04-04 NOTE — Progress Notes (Signed)
Subjective:    Patient ID: Carlos Hughes, male    DOB: 02/09/1971, 43 y.o.   MRN: [id number]  HPI   Carlos BlitzShawn D Penrod is a 43 y.o. male with HIV infection who is doing superbly well on HIS  antiviral regimen, epzicom, reyataz, norvir with undetectable viral load and health cd4 count.   He has been following with [facility] now with Sabra Heckyan Sanford, MD.  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 We DID NOT at that time look for Neurosyphilis but gave 1 shot of IM PCN.  I told him that NOW that his titer has NOT dropped AT ALL and is still at 1:512 that we should investigate OR treat for Neurosyphilis.   He ultimately would not agree to LP or empiric treatment for Neurosyphilis but wanted another round of IM PCN x 3. His titer has come down now to 1:128  [facility] and viral load is suppressed and CD4 healthy   Lab Results  Component Value Date   HIV1RNAQUANT <20 03/21/2014   Lab Results  Component Value Date   CD4TABS 560 03/21/2014   CD4TABS 510 01/19/2014   CD4TABS 530 01/12/2013      Review of Systems  Constitutional: Negative for fever, chills, diaphoresis, activity change, appetite change, fatigue and unexpected weight change.  HENT: Negative for congestion, rhinorrhea, sinus pressure, sneezing, sore throat and trouble swallowing.   Eyes: Negative for photophobia and visual disturbance.  Respiratory: Negative for cough, chest tightness, shortness of breath, wheezing and stridor.   Cardiovascular: Negative for palpitations and leg swelling.  Gastrointestinal: Negative for nausea, vomiting, abdominal pain, diarrhea, constipation, blood in stool, abdominal distention and anal bleeding.  Genitourinary: Negative for dysuria, hematuria, flank pain and difficulty urinating.  Musculoskeletal: Negative for arthralgias, back pain, gait problem, joint swelling and myalgias.  Skin: Negative for color  change, pallor and wound.  Neurological: Negative for dizziness, tremors, weakness and light-headedness.  Hematological: Negative for adenopathy. Does not bruise/bleed easily.  Psychiatric/Behavioral: Negative for behavioral problems, confusion, sleep disturbance, dysphoric mood, decreased concentration and agitation.       Objective:   Physical Exam  Constitutional: He is oriented to person, place, and time. He appears well-developed and well-nourished. No distress.  HENT:  Head: Normocephalic and atraumatic.  Eyes: Conjunctivae and EOM are normal.  Neck: Normal range of motion. Neck supple.  Cardiovascular: Normal rate and regular rhythm.   Pulmonary/Chest: Effort normal. No respiratory distress. He has no wheezes.  Abdominal: Soft. He exhibits no distension.  Musculoskeletal: He exhibits no edema and no tenderness.  Neurological: He is alert and oriented to person, place, and time. He exhibits normal muscle tone. Coordination normal.  Skin: Skin is warm and dry. Rash noted. He is not diaphoretic. No erythema. No pallor.  Psychiatric: He has a normal mood and affect. His behavior is normal. Judgment and thought content normal.       Assessment & Plan:   HIV: continue TRIUMEQ [facility].GFR is at 48 but would not renally adjust his TRIUMEQ at this point. 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 Titer is coming down appropriately so far. Needs to be followed per cdc guideilnes  Will go with his wishes. If titer  does NOT come down FOUR fold I will treat IV penicillin TWO WEEKS   CKD: needs nephrologist in [place]. His Cr is up to 1.9

## 2014-04-27 ENCOUNTER — Telehealth: Payer: Self-pay | Admitting: *Deleted

## 2014-04-27 NOTE — Telephone Encounter (Signed)
We took care of this during clinic

## 2014-04-27 NOTE — Telephone Encounter (Signed)
Medicaid - Prior Approval - Clonidine patch.  Downloaded [place] Medicaid form for MD completion.  Shared with Dr. Daiva EvesVan Dam and pharmacy resident for completion.  [facility].

## 2014-05-30 ENCOUNTER — Telehealth: Payer: Self-pay | Admitting: *Deleted

## 2014-05-30 NOTE — Telephone Encounter (Signed)
[place]. [facility]. He will fax a signed ROI for his records.  Will pass [facility] and [place]. Patient asking about his clonidine patch.  Spoke with [facility]. Patient's clonidine patch has been approved, his local [facility] has it ready, will call him.  Andree CossHowell, Jedaiah Rathbun M, RN

## 2014-09-18 ENCOUNTER — Other Ambulatory Visit: Payer: Self-pay | Admitting: Infectious Disease

## 2014-11-29 ENCOUNTER — Other Ambulatory Visit: Payer: Self-pay | Admitting: Infectious Disease

## 2015-02-20 ENCOUNTER — Other Ambulatory Visit: Payer: Self-pay | Admitting: Infectious Disease

## 2015-03-18 ENCOUNTER — Other Ambulatory Visit: Payer: Self-pay | Admitting: Infectious Disease
